# Patient Record
Sex: Male | Born: 1980 | Race: White | Hispanic: No | Smoking: Former smoker
Health system: Southern US, Community
[De-identification: ages and names within clinical notes are randomized; demographics above are authoritative.]

## PROBLEM LIST (undated history)

## (undated) DIAGNOSIS — E7800 Pure hypercholesterolemia, unspecified: Secondary | ICD-10-CM

## (undated) DIAGNOSIS — K219 Gastro-esophageal reflux disease without esophagitis: Secondary | ICD-10-CM

## (undated) DIAGNOSIS — M79606 Pain in leg, unspecified: Secondary | ICD-10-CM

## (undated) DIAGNOSIS — R079 Chest pain, unspecified: Secondary | ICD-10-CM

## (undated) HISTORY — PX: KNEE SURGERY: SHX244

---
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
## 2007-04-27 ENCOUNTER — Ambulatory Visit: Payer: Self-pay | Admitting: Vascular Surgery

## 2007-04-27 ENCOUNTER — Ambulatory Visit (HOSPITAL_COMMUNITY): Admission: RE | Admit: 2007-04-27 | Discharge: 2007-04-27 | Payer: Self-pay | Admitting: Orthopaedic Surgery

## 2007-08-07 ENCOUNTER — Emergency Department (HOSPITAL_COMMUNITY): Admission: EM | Admit: 2007-08-07 | Discharge: 2007-08-07 | Payer: Self-pay | Admitting: Family Medicine

## 2011-07-22 LAB — DIFFERENTIAL
Basophils Absolute: 0.1
Basophils Relative: 0
Lymphocytes Relative: 11 — ABNORMAL LOW
Neutro Abs: 11.1 — ABNORMAL HIGH
Neutrophils Relative %: 79 — ABNORMAL HIGH

## 2011-07-22 LAB — GC/CHLAMYDIA PROBE AMP, GENITAL
Chlamydia, DNA Probe: NEGATIVE
GC Probe Amp, Genital: NEGATIVE

## 2011-07-22 LAB — CBC
MCHC: 34.1
RDW: 13.9

## 2011-07-22 LAB — POCT URINALYSIS DIP (DEVICE)
Glucose, UA: NEGATIVE
Ketones, ur: 15 — AB
Operator id: 282151
Protein, ur: 30 — AB
Specific Gravity, Urine: 1.02
Urobilinogen, UA: 4 — ABNORMAL HIGH

## 2011-07-22 LAB — URINE CULTURE: Colony Count: 3000

## 2012-07-30 ENCOUNTER — Ambulatory Visit: Payer: Self-pay | Admitting: Family Medicine

## 2012-07-30 VITALS — BP 118/68 | HR 68 | Temp 98.2°F | Resp 16 | Ht 70.0 in | Wt 173.8 lb

## 2012-07-30 DIAGNOSIS — J40 Bronchitis, not specified as acute or chronic: Secondary | ICD-10-CM

## 2012-07-30 MED ORDER — AZITHROMYCIN 250 MG PO TABS: ORAL_TABLET | ORAL | Status: DC

## 2012-07-30 MED ORDER — PREDNISONE 20 MG PO TABS: 40.0000 mg | ORAL_TABLET | Freq: Every day | ORAL | Status: DC

## 2012-07-30 NOTE — Patient Instructions (Addendum)
Smoking Cessation Quitting smoking is important to your health and has many advantages. However, it is not always easy to quit since nicotine is a very addictive drug. Often times, people try 3 times or more before being able to quit. This document explains the best ways for you to prepare to quit smoking. Quitting takes hard work and a lot of effort, but you can do it. ADVANTAGES OF QUITTING SMOKING  You will live longer, feel better, and live better.  Your body will feel the impact of quitting smoking almost immediately.  Within 20 minutes, blood pressure decreases. Your pulse returns to its normal level.  After 8 hours, carbon monoxide levels in the blood return to normal. Your oxygen level increases.  After 24 hours, the chance of having a heart attack starts to decrease. Your breath, hair, and body stop smelling like smoke.  After 48 hours, damaged nerve endings begin to recover. Your sense of taste and smell improve.  After 72 hours, the body is virtually free of nicotine. Your bronchial tubes relax and breathing becomes easier.  After 2 to 12 weeks, lungs can hold more air. Exercise becomes easier and circulation improves.  The risk of having a heart attack, stroke, cancer, or lung disease is greatly reduced.  After 1 year, the risk of coronary heart disease is cut in half.  After 5 years, the risk of stroke falls to the same as a nonsmoker.  After 10 years, the risk of lung cancer is cut in half and the risk of other cancers decreases significantly.  After 15 years, the risk of coronary heart disease drops, usually to the level of a nonsmoker.  If you are pregnant, quitting smoking will improve your chances of having a healthy baby.  The people you live with, especially any children, will be healthier.  You will have extra money to spend on things other than cigarettes. QUESTIONS TO THINK ABOUT BEFORE ATTEMPTING TO QUIT You may want to talk about your answers with your  caregiver.  Why do you want to quit?  If you tried to quit in the past, what helped and what did not?  What will be the most difficult situations for you after you quit? How will you plan to handle them?  Who can help you through the tough times? Your family? Friends? A caregiver?  What pleasures do you get from smoking? What ways can you still get pleasure if you quit? Here are some questions to ask your caregiver:  How can you help me to be successful at quitting?  What medicine do you think would be best for me and how should I take it?  What should I do if I need more help?  What is smoking withdrawal like? How can I get information on withdrawal? GET READY  Set a quit date.  Change your environment by getting rid of all cigarettes, ashtrays, matches, and lighters in your home, car, or work. Do not let people smoke in your home.  Review your past attempts to quit. Think about what worked and what did not. GET SUPPORT AND ENCOURAGEMENT You have a better chance of being successful if you have help. You can get support in many ways.  Tell your family, friends, and co-workers that you are going to quit and need their support. Ask them not to smoke around you.  Get individual, group, or telephone counseling and support. Programs are available at local hospitals and health centers. Call your local health department for   information about programs in your area.  Spiritual beliefs and practices may help some smokers quit.  Download a "quit meter" on your computer to keep track of quit statistics, such as how long you have gone without smoking, cigarettes not smoked, and money saved.  Get a self-help book about quitting smoking and staying off of tobacco. LEARN NEW SKILLS AND BEHAVIORS  Distract yourself from urges to smoke. Talk to someone, go for a walk, or occupy your time with a task.  Change your normal routine. Take a different route to work. Drink tea instead of coffee.  Eat breakfast in a different place.  Reduce your stress. Take a hot bath, exercise, or read a book.  Plan something enjoyable to do every day. Reward yourself for not smoking.  Explore interactive web-based programs that specialize in helping you quit. GET MEDICINE AND USE IT CORRECTLY Medicines can help you stop smoking and decrease the urge to smoke. Combining medicine with the above behavioral methods and support can greatly increase your chances of successfully quitting smoking.  Nicotine replacement therapy helps deliver nicotine to your body without the negative effects and risks of smoking. Nicotine replacement therapy includes nicotine gum, lozenges, inhalers, nasal sprays, and skin patches. Some may be available over-the-counter and others require a prescription.  Antidepressant medicine helps people abstain from smoking, but how this works is unknown. This medicine is available by prescription.  Nicotinic receptor partial agonist medicine simulates the effect of nicotine in your brain. This medicine is available by prescription. Ask your caregiver for advice about which medicines to use and how to use them based on your health history. Your caregiver will tell you what side effects to look out for if you choose to be on a medicine or therapy. Carefully read the information on the package. Do not use any other product containing nicotine while using a nicotine replacement product.  RELAPSE OR DIFFICULT SITUATIONS Most relapses occur within the first 3 months after quitting. Do not be discouraged if you start smoking again. Remember, most people try several times before finally quitting. You may have symptoms of withdrawal because your body is used to nicotine. You may crave cigarettes, be irritable, feel very hungry, cough often, get headaches, or have difficulty concentrating. The withdrawal symptoms are only temporary. They are strongest when you first quit, but they will go away within  10 14 days. To reduce the chances of relapse, try to:  Avoid drinking alcohol. Drinking lowers your chances of successfully quitting.  Reduce the amount of caffeine you consume. Once you quit smoking, the amount of caffeine in your body increases and can give you symptoms, such as a rapid heartbeat, sweating, and anxiety.  Avoid smokers because they can make you want to smoke.  Do not let weight gain distract you. Many smokers will gain weight when they quit, usually less than 10 pounds. Eat a healthy diet and stay active. You can always lose the weight gained after you quit.  Find ways to improve your mood other than smoking. FOR MORE INFORMATION  www.smokefree.gov  Document Released: 09/22/2001 Document Revised: 03/29/2012 Document Reviewed: 01/07/2012 [employer] Patient Information 2013 [employer].  

## 2012-07-30 NOTE — Progress Notes (Signed)
31 yo man with cough x 2 days.  No fever.  Phlegm is brownish. Working in Buyer, retail in [place] Recent trip to [place] Wife due in February  Objective:  NAD HEENT: normal Chest:  Wheezes  Assessment:  Acute bronchitis  Plan:   1. Bronchitis  azithromycin (ZITHROMAX Z-PAK) 250 MG tablet, predniSONE (DELTASONE) 20 MG tablet  encouraged to quit cigarettes

## 2013-10-16 ENCOUNTER — Ambulatory Visit: Payer: Self-pay | Admitting: Internal Medicine

## 2013-10-16 VITALS — BP 100/60 | HR 90 | Temp 99.6°F | Resp 18 | Ht 69.0 in | Wt 180.0 lb

## 2013-10-16 DIAGNOSIS — J111 Influenza due to unidentified influenza virus with other respiratory manifestations: Secondary | ICD-10-CM

## 2013-10-16 DIAGNOSIS — R509 Fever, unspecified: Secondary | ICD-10-CM

## 2013-10-16 MED ORDER — HYDROCODONE-HOMATROPINE 5-1.5 MG/5ML PO SYRP: 5.0000 mL | ORAL_SOLUTION | Freq: Four times a day (QID) | ORAL | Status: DC | PRN

## 2013-10-16 MED ORDER — DICLOFENAC SODIUM 75 MG PO TBEC: 75.0000 mg | DELAYED_RELEASE_TABLET | Freq: Two times a day (BID) | ORAL | Status: DC

## 2013-10-16 NOTE — Patient Instructions (Signed)

## 2013-10-16 NOTE — Progress Notes (Signed)
Subjective:    Patient ID: Carlos Lambert, male    DOB: 08/04/1981, 33 y.o.   MRN: [id number]  This [facility] by Carlos Lambert, Scribe. This patient was seen in room 11 and the patient's care was started at 4:21 PM.  Fever  This is a new problem. The current episode started in the past 7 days. The problem occurs constantly. The problem has been unchanged. His temperature was unmeasured prior to arrival. Associated symptoms include coughing and headaches. Pertinent negatives include no abdominal pain, chest pain, congestion, diarrhea, nausea, rash, sore throat, vomiting or wheezing. He has tried nothing for the symptoms.   HPI Comments: Carlos Lambert is a 33 y.o. male who presents to the [facility] complaining of a constant subjective fever that started about 4 days ago.  Current temperature is 99.87F.  Pt has associated non productive cough, HA, and generalized weakness.  Pt denies sore throat and rhinorrhea.  He denies receiving the flu shot this year.     Past Surgical History  Procedure Laterality Date  . Knee surgery      Family History  Problem Relation Age of Onset  . Hypertension Father     History   Social History  . Marital Status: Married    Spouse Name: N/A    Number of Children: N/A  . Years of Education: N/A   Occupational History  . Not on file.   Social History Main Topics  . Smoking status: Former Games developermoker  . Smokeless tobacco: Not on file  . Alcohol Use: Not on file  . Drug Use: Not on file  . Sexual Activity: Not on file   Other Topics Concern  . Not on file   Social History Narrative  . No narrative on file    Review of Systems  Constitutional: Positive for fever. Negative for chills.  HENT: Negative for congestion, rhinorrhea and sore throat.   Respiratory: Positive for cough. Negative for shortness of breath and wheezing.   Cardiovascular: Negative for chest pain.  Gastrointestinal: Negative  for nausea, vomiting, abdominal pain and diarrhea.  Musculoskeletal: Negative for back pain.  Skin: Negative for color change and rash.  Neurological: Positive for headaches.      Objective:   Physical Exam  Nursing note and vitals reviewed. Constitutional: He is oriented to person, place, and time. He appears well-developed and well-nourished. No distress.  HENT:  Head: Normocephalic and atraumatic.  Eyes: Conjunctivae are normal. Right eye exhibits no discharge. Left eye exhibits no discharge.  Neck: Normal range of motion.  Cardiovascular: Normal rate, regular rhythm and normal heart sounds.   No murmur heard. Pulmonary/Chest: Effort normal and breath sounds normal. No respiratory distress. He has no wheezes.  Musculoskeletal: Normal range of motion.  Neurological: He is alert and oriented to person, place, and time.  Skin: Skin is warm and dry.  Psychiatric: He has a normal mood and affect. His behavior is normal.   Triage Vitals: P 100/60  Pulse 90  Temp(Src) 99.6 F (37.6 C) (Oral)  Resp 18  Ht 5\' 9"  (1.753 m)  Wt 180 lb (81.647 kg)  BMI 26.57 kg/m2  SpO2 97%  DIAGNOSTIC STUDIES: Oxygen Saturation is 97% on RA, normal by my interpretation.    COORDINATION OF CARE: 4:24 PM-Will prescribe cough medicine and Voltaren.  Patient informed of current plan of treatment and evaluation and agrees with plan.       Assessment & Plan:  Influenza  Meds ordered this encounter  Medications  . Pseudoephedrine-APAP-DM (DAYQUIL PO)    Sig: Take by mouth.  . diclofenac (VOLTAREN) 75 MG EC tablet    Sig: Take 1 tablet (75 mg total) by mouth 2 (two) times daily. As needed for fever, Headache and muscle aches    Dispense:  30 tablet    Refill:  0  . HYDROcodone-homatropine (HYCODAN) 5-1.5 MG/5ML syrup    Sig: Take 5 mLs by mouth every 6 (six) hours as needed for cough.    Dispense:  120 mL    Refill:  0

## 2013-10-17 ENCOUNTER — Telehealth: Payer: Self-pay

## 2013-10-17 NOTE — Telephone Encounter (Signed)
Notified pt I have OOW note for him if he needs it. Pt advised he already has one and is not needed.

## 2013-12-18 ENCOUNTER — Ambulatory Visit (INDEPENDENT_AMBULATORY_CARE_PROVIDER_SITE_OTHER): Payer: 59 | Admitting: Family Medicine

## 2013-12-18 VITALS — BP 98/58 | HR 66 | Temp 97.5°F | Resp 16 | Ht 69.0 in | Wt 185.0 lb

## 2013-12-18 DIAGNOSIS — R361 Hematospermia: Secondary | ICD-10-CM

## 2013-12-18 DIAGNOSIS — Z9889 Other specified postprocedural states: Secondary | ICD-10-CM

## 2013-12-18 DIAGNOSIS — Z0000 Encounter for general adult medical examination without abnormal findings: Secondary | ICD-10-CM

## 2013-12-18 DIAGNOSIS — Z630 Problems in relationship with spouse or partner: Secondary | ICD-10-CM

## 2013-12-18 LAB — POCT UA - MICROSCOPIC ONLY
BACTERIA, U MICROSCOPIC: NEGATIVE
CASTS, UR, LPF, POC: NEGATIVE
Crystals, Ur, HPF, POC: NEGATIVE
Epithelial cells, urine per micros: NEGATIVE
Mucus, UA: NEGATIVE
RBC, URINE, MICROSCOPIC: NEGATIVE
WBC, Ur, HPF, POC: NEGATIVE
Yeast, UA: NEGATIVE

## 2013-12-18 LAB — POCT CBC
Granulocyte percent: 54 %G (ref 37–80)
HCT, POC: 43.9 % (ref 43.5–53.7)
HEMOGLOBIN: 14.1 g/dL (ref 14.1–18.1)
Lymph, poc: 2.8 (ref 0.6–3.4)
MCH, POC: 30.5 pg (ref 27–31.2)
MCHC: 32.1 g/dL (ref 31.8–35.4)
MCV: 95.1 fL (ref 80–97)
MID (cbc): 0.6 (ref 0–0.9)
MPV: 8.8 fL (ref 0–99.8)
PLATELET COUNT, POC: 248 10*3/uL (ref 142–424)
POC GRANULOCYTE: 3.9 (ref 2–6.9)
POC LYMPH PERCENT: 38.1 %L (ref 10–50)
POC MID %: 7.9 % (ref 0–12)
RBC: 4.62 M/uL — AB (ref 4.69–6.13)
RDW, POC: 13.9 %
WBC: 7.3 10*3/uL (ref 4.6–10.2)

## 2013-12-18 LAB — COMPREHENSIVE METABOLIC PANEL
ALK PHOS: 50 U/L (ref 39–117)
ALT: 15 U/L (ref 0–53)
AST: 17 U/L (ref 0–37)
Albumin: 4.8 g/dL (ref 3.5–5.2)
BILIRUBIN TOTAL: 0.4 mg/dL (ref 0.2–1.2)
BUN: 17 mg/dL (ref 6–23)
CO2: 30 meq/L (ref 19–32)
CREATININE: 0.93 mg/dL (ref 0.50–1.35)
Calcium: 9.6 mg/dL (ref 8.4–10.5)
Chloride: 102 mEq/L (ref 96–112)
GLUCOSE: 99 mg/dL (ref 70–99)
Potassium: 4.4 mEq/L (ref 3.5–5.3)
SODIUM: 138 meq/L (ref 135–145)
TOTAL PROTEIN: 7.1 g/dL (ref 6.0–8.3)

## 2013-12-18 LAB — POCT URINALYSIS DIPSTICK
BILIRUBIN UA: NEGATIVE
Blood, UA: NEGATIVE
GLUCOSE UA: NEGATIVE
Ketones, UA: NEGATIVE
LEUKOCYTES UA: NEGATIVE
NITRITE UA: NEGATIVE
PH UA: 5.5
Protein, UA: NEGATIVE
Spec Grav, UA: <=1.005
UROBILINOGEN UA: 0.2

## 2013-12-18 LAB — LIPID PANEL
CHOL/HDL RATIO: 5.2 ratio
CHOLESTEROL: 199 mg/dL (ref 0–200)
HDL: 38 mg/dL — AB (ref >39)
LDL CALC: 130 mg/dL — AB (ref 0–99)
TRIGLYCERIDES: 153 mg/dL — AB (ref <150)
VLDL: 31 mg/dL (ref 0–40)

## 2013-12-18 LAB — PSA: PSA: 0.91 ng/mL (ref <=4.00)

## 2013-12-18 LAB — HIV ANTIBODY (ROUTINE TESTING W REFLEX): HIV: NONREACTIVE

## 2013-12-18 LAB — RPR

## 2013-12-18 LAB — TSH: TSH: 3.19 u[IU]/mL (ref 0.350–4.500)

## 2013-12-18 MED ORDER — CIPROFLOXACIN HCL 500 MG PO TABS: 500.0000 mg | ORAL_TABLET | Freq: Two times a day (BID) | ORAL | Status: DC

## 2013-12-18 NOTE — Patient Instructions (Addendum)
Return as needed  Ciprofloxacin 500 mg one twice daily  If blood persists in semen we will need to make a urology referral so let me know.

## 2013-12-18 NOTE — Progress Notes (Signed)
Physical examination:  History: 33 year old male who is here for physical examination. He has been noticing a dark brown in his semen. It is more noticeable when he has gone longer intervals between masturbation. He has no other major symptoms related to that. No history of STDs. However his wife did leave him about 6 weeks ago, and she moved back to [place]. There is no known instability, and he has not had any other relationships. Since he was last seen by me he has had surgery on the right anterior cruciate ligament, and has done well from that.  Past medical history: Medications: None Allergies: None Past medical illnesses: None Surgical history: Right anterior cruciate ligament repair of  Family history: Both parents are living, and high cholesterol. His brother and sister living and well. He has one child who is 79 months old.  Social history: Wife recently left him as noted above. She also left his son, who he is raising with the assistance of his parents. 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 He has not play sports he does walk. He does smoke one pack of cigarettes a day, does not drink or use drugs. 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 He intends to return to that sometime.  Review of systems: Constitutional: Unremarkable HEENT: Unremarkable Respiratory: Unremarkable Cardiovascular: Unremarkable Gastrointestinal: Unremarkable Endocrine: Unremarkable Genitourinary: No dysuria as noted above Musculoskeletal: Unremarkable except for the history of the knee surgery. It is doing well. Dermatologic: Unremarkable Allergies: Unremarkable Neurologic: Unremarkable Hematologic: Unremarkable Psychiatric: Unremarkable. Denies depression and feels like he is doing well, that it was a relief with his wife left him.  Physical examination: Well-developed well-nourished young man in no  major acute distress. His TMs are normal. Eyes PERRLA. Fundi benign. Throat clear. Teeth good. Neck supple without nodes. Chest is clear to auscultation. Heart regular without murmurs gallops or arrhythmias. Abdomen soft without mass or tenderness. Normal male external genitalia, uncircumcised, testes descended. No hernias. Digital rectal exam reveals prostate gland to be essentially normal in size and contour. Extremities unremarkable. Skin has tattoos on his arms, otherwise unremarkable.  Assessment: Normal physical examination A history of anterior cruciate ligament repair History of marital dysfunction Meds for me a  Plan: Check labs including CBC and urinalysis, and STD testing, C. met, lipids, TSH and will also do a PSA and he is young because of the change in his ejaculate.   Results for orders placed in visit on 12/18/13  POCT CBC      Result Value Ref Range   WBC 7.3  4.6 - 10.2 K/uL   Lymph, poc 2.8  0.6 - 3.4   POC LYMPH PERCENT 38.1  10 - 50 %L   MID (cbc) 0.6  0 - 0.9   POC MID % 7.9  0 - 12 %M   POC Granulocyte 3.9  2 - 6.9   Granulocyte percent 54.0  37 - 80 %G   RBC 4.62 (*) 4.69 - 6.13 M/uL   Hemoglobin 14.1  14.1 - 18.1 g/dL   HCT, POC 43.9  43.5 - 53.7 %   MCV 95.1  80 - 97 fL   MCH, POC 30.5  27 - 31.2 pg   MCHC 32.1  31.8 - 35.4 g/dL   RDW, POC 13.9     Platelet Count, POC 248  142 - 424 K/uL  MPV 8.8  0 - 99.8 fL  POCT URINALYSIS DIPSTICK      Result Value Ref Range   Color, UA light yellow     Clarity, UA clear     Glucose, UA neg     Bilirubin, UA neg     Ketones, UA neg     Spec Grav, UA <=1.005     Blood, UA neg     pH, UA 5.5     Protein, UA neg     Urobilinogen, UA 0.2     Nitrite, UA neg     Leukocytes, UA Negative    POCT UA - MICROSCOPIC ONLY      Result Value Ref Range   WBC, Ur, HPF, POC neg     RBC, urine, microscopic neg     Bacteria, U Microscopic neg     Mucus, UA neg     Epithelial cells, urine per micros neg     Crystals, Ur,  HPF, POC neg     Casts, Ur, LPF, POC neg     Yeast, UA neg

## 2013-12-19 LAB — GC/CHLAMYDIA PROBE AMP
CT Probe RNA: NEGATIVE
GC PROBE AMP APTIMA: NEGATIVE

## 2013-12-19 LAB — HSV(HERPES SIMPLEX VRS) I + II AB-IGG
HSV 1 Glycoprotein G Ab, IgG: 9.74 IV — ABNORMAL HIGH
HSV 2 Glycoprotein G Ab, IgG: <0.1 IV

## 2016-08-21 ENCOUNTER — Encounter: Payer: Self-pay | Admitting: Urgent Care

## 2016-08-21 ENCOUNTER — Ambulatory Visit (INDEPENDENT_AMBULATORY_CARE_PROVIDER_SITE_OTHER): Payer: 59 | Admitting: Urgent Care

## 2016-08-21 VITALS — BP 116/78 | HR 64 | Temp 97.9°F | Resp 16 | Ht 69.0 in | Wt 188.6 lb

## 2016-08-21 DIAGNOSIS — Z0000 Encounter for general adult medical examination without abnormal findings: Secondary | ICD-10-CM | POA: Diagnosis not present

## 2016-08-21 DIAGNOSIS — Z87891 Personal history of nicotine dependence: Secondary | ICD-10-CM

## 2016-08-21 LAB — TSH: TSH: 2.05 mIU/L (ref 0.40–4.50)

## 2016-08-21 LAB — CBC
HCT: 44 % (ref 38.5–50.0)
Hemoglobin: 14.9 g/dL (ref 13.2–17.1)
MCH: 30.5 pg (ref 27.0–33.0)
MCHC: 33.9 g/dL (ref 32.0–36.0)
MCV: 90.2 fL (ref 80.0–100.0)
MPV: 9.1 fL (ref 7.5–12.5)
Platelets: 226 10*3/uL (ref 140–400)
RBC: 4.88 MIL/uL (ref 4.20–5.80)
RDW: 13.6 % (ref 11.0–15.0)
WBC: 6 10*3/uL (ref 3.8–10.8)

## 2016-08-21 LAB — LIPID PANEL
CHOLESTEROL: 225 mg/dL — AB (ref <200)
HDL: 36 mg/dL — ABNORMAL LOW (ref >40)
LDL Cholesterol: 148 mg/dL — ABNORMAL HIGH
TRIGLYCERIDES: 205 mg/dL — AB (ref <150)
Total CHOL/HDL Ratio: 6.3 Ratio — ABNORMAL HIGH (ref <5.0)
VLDL: 41 mg/dL — ABNORMAL HIGH (ref <30)

## 2016-08-21 LAB — BASIC METABOLIC PANEL
BUN: 16 mg/dL (ref 7–25)
CHLORIDE: 106 mmol/L (ref 98–110)
CO2: 26 mmol/L (ref 20–31)
Calcium: 9.3 mg/dL (ref 8.6–10.3)
Creat: 0.9 mg/dL (ref 0.60–1.35)
GLUCOSE: 97 mg/dL (ref 65–99)
POTASSIUM: 4.7 mmol/L (ref 3.5–5.3)
SODIUM: 141 mmol/L (ref 135–146)

## 2016-08-21 NOTE — Progress Notes (Signed)
MRN: [id number]  Subjective:   Carlos Lambert is a 35 y.o. male presenting for annual physical exam.  Patient is divorced, has 1 son, good relationships at home. Declines screening for HIV. Used to smoke 1ppd for 10 years. Quit smoking 1.5 months ago. This came about because his mother was diagnosed with small cell lung carcinoma recently and he is currently helping her get treatment. Denies smoking cigarettes or drinking alcohol.   Medical care team includes: PCP: No PCP Per Patient Vision: Wears contact lenses, last eye exam was ~2 years ago, plans on setting up an eye exam soon. Dental: Cleanings once a year. Specialists: None.    Carlos Lambert does not have any active problems on his problem list.   Carlos Lambert currently has no medications in their medication list. He has No Known Allergies.  Carlos Lambert  has no past medical history on file. Also  has a past surgical history that includes Knee surgery. Had ACL repair of right knee.  His family history includes Hypertension in his father.  Immunizations: Refused flu vaccine. TDAP 08/2014.  Review of Systems  Constitutional: Negative for chills, diaphoresis, fever, malaise/fatigue and weight loss.  HENT: Negative for congestion, ear discharge, ear pain, hearing loss, nosebleeds, sore throat and tinnitus.   Eyes: Negative for blurred vision, double vision, photophobia, pain, discharge and redness.  Respiratory: Negative for cough, shortness of breath and wheezing.   Cardiovascular: Negative for chest pain, palpitations and leg swelling.  Gastrointestinal: Negative for abdominal pain, blood in stool, constipation, diarrhea, nausea and vomiting.  Genitourinary: Negative for dysuria, flank pain, frequency, hematuria and urgency.  Musculoskeletal: Negative for back pain, joint pain and myalgias.  Skin: Negative for itching and rash.  Neurological: Negative for dizziness, tingling, seizures, loss of consciousness, weakness and headaches.    Endo/Heme/Allergies: Negative for polydipsia.  Psychiatric/Behavioral: Negative for depression, hallucinations, memory loss, substance abuse and suicidal ideas. The patient is not nervous/anxious and does not have insomnia.    Objective:   Vitals: BP 116/78 (BP Location: Right Arm, Patient Position: Sitting, Cuff Size: Small)   Pulse 64   Temp 97.9 F (36.6 C) (Oral)   Resp 16   Ht 5\' 9"  (1.753 m)   Wt 188 lb 9.6 oz (85.5 kg)   SpO2 98%   BMI 27.85 kg/m   Physical Exam  Constitutional: He is oriented to person, place, and time. He appears well-developed and well-nourished.  HENT:  TM's intact bilaterally, no effusions or erythema. Nasal turbinates pink and moist, nasal passages patent. No sinus tenderness. Oropharynx clear, mucous membranes moist, dentition in good repair.  Eyes: Conjunctivae and EOM are normal. Pupils are equal, round, and reactive to light. Right eye exhibits no discharge. Left eye exhibits no discharge. No scleral icterus.  Neck: Normal range of motion. Neck supple. No thyromegaly present.  Cardiovascular: Normal rate, regular rhythm and intact distal pulses.  Exam reveals no gallop and no friction rub.   No murmur heard. Pulmonary/Chest: No stridor. No respiratory distress. He has no wheezes. He has no rales.  Abdominal: Soft. Bowel sounds are normal. He exhibits no distension and no mass. There is no tenderness.  Musculoskeletal: Normal range of motion. He exhibits no edema or tenderness.  Lymphadenopathy:    He has no cervical adenopathy.  Neurological: He is alert and oriented to person, place, and time. He has normal reflexes.  Skin: Skin is warm and dry. No rash noted. No erythema. No pallor.  Psychiatric: He has a normal mood  and affect.   Assessment and Plan :   1. Annual physical exam - Medically healthy, pleasant person. Discussed healthy lifestyle, diet, exercise, preventative care, vaccinations, and addressed patient's concerns.  - CBC -  Lipid panel - TSH - Basic metabolic panel  2. History of smoking for 6-10 years - Congratulated patient on smoking cessation. Encouraged him to not pick smoking back up.  Wallis BambergMario Oliverio Cho, PA-C [facility] [phone] 08/21/2016  8:52 AM

## 2016-08-21 NOTE — Patient Instructions (Addendum)
Keeping you healthy  Get these tests  Blood pressure- Have your blood pressure checked once a year by your healthcare provider.  Normal blood pressure is 120/80.  Weight- Have your body mass index (BMI) calculated to screen for obesity.  BMI is a measure of body fat based on height and weight. You can also calculate your own BMI at https://www.west-esparza.com/www.nhlbisupport.com/bmi/.  Cholesterol- Have your cholesterol checked regularly starting at age 33, sooner may be necessary if you have diabetes, high blood pressure, if a family member developed heart diseases at an early age or if you smoke.   Chlamydia, HIV, and other sexual transmitted disease- Get screened each year until the age of 33 then within three months of each new sexual partner.  Diabetes- Have your blood sugar checked regularly if you have high blood pressure, high cholesterol, a family history of diabetes or if you are overweight.  Get these vaccines  Flu shot- Every fall.  Tetanus shot- Every 10 years.  Menactra- Single dose; prevents meningitis.  Take these steps  Don't smoke- If you do smoke, ask your healthcare provider about quitting. For tips on how to quit, go to www.smokefree.gov or call 1-800-QUIT-NOW.  Be physically active- Exercise 5 days a week for at least 30 minutes.  If you are not already physically active start slow and gradually work up to 30 minutes of moderate physical activity.  Examples of moderate activity include walking briskly, mowing the yard, dancing, swimming bicycling, etc.  Eat a healthy diet- Eat a variety of healthy foods such as fruits, vegetables, low fat milk, low fat cheese, yogurt, lean meats, poultry, fish, beans, tofu, etc.  For more information on healthy eating, go to www.thenutritionsource.org  Drink alcohol in moderation- Limit alcohol intake two drinks or less a day.  Never drink and drive.  Dentist- Brush and floss teeth twice daily; visit your dentis twice a year.  Depression-Your emotional  health is as important as your physical health.  If you're feeling down, losing interest in things you normally enjoy please talk with your healthcare provider.  Gun Safety- If you keep a gun in your home, keep it unloaded and with the safety lock on.  Bullets should be stored separately.  Helmet use- Always wear a helmet when riding a motorcycle, bicycle, rollerblading or skateboarding.  Safe sex- If you may be exposed to a sexually transmitted infection, use a condom  Seat belts- Seat bels can save your life; always wear one.  Smoke/Carbon Monoxide detectors- These detectors need to be installed on the appropriate level of your home.  Replace batteries at least once a year.  Skin Cancer- When out in the sun, cover up and use sunscreen SPF 15 or higher.  Violence- If anyone is threatening or hurting you, please tell your healthcare provider.    Smoking Hazards Smoking cigarettes is extremely bad for your health. Tobacco smoke has over 200 known poisons in it. It contains the poisonous gases nitrogen oxide and carbon monoxide. There are over 60 chemicals in tobacco smoke that cause cancer. Some of the chemicals found in cigarette smoke include:   Cyanide.   Benzene.   Formaldehyde.   Methanol (wood alcohol).   Acetylene (fuel used in welding torches).   Ammonia.  Even smoking lightly shortens your life expectancy by several years. You can greatly reduce the risk of medical problems for you and your family by stopping now. Smoking is the most preventable cause of death and disease in our society. Within days  of quitting smoking, your circulation improves, you decrease the risk of having a heart attack, and your lung capacity improves. There may be some increased phlegm in the first few days after quitting, and it may take months for your lungs to clear up completely. Quitting for 10 years reduces your risk of developing lung cancer to almost that of a nonsmoker.  WHAT ARE THE  RISKS OF SMOKING? Cigarette smokers have an increased risk of many serious medical problems, including:  Lung cancer.   Lung disease (such as pneumonia, bronchitis, and emphysema).   Heart attack and chest pain due to the heart not getting enough oxygen (angina).   Heart disease and peripheral blood vessel disease.   Hypertension.   Stroke.   Oral cancer (cancer of the lip, mouth, or voice box).   Bladder cancer.   Pancreatic cancer.   Cervical cancer.   Pregnancy complications, including premature birth.   Stillbirths and smaller newborn babies, birth defects, and genetic damage to sperm.   Early menopause.   Lower estrogen level for women.   Infertility.   Facial wrinkles.   Blindness.   Increased risk of broken bones (fractures).   Senile dementia.   Stomach ulcers and internal bleeding.   Delayed wound healing and increased risk of complications during surgery. Because of secondhand smoke exposure, children of smokers have an increased risk of the following:   Sudden infant death syndrome (SIDS).   Respiratory infections.   Lung cancer.   Heart disease.   Ear infections.  WHY IS SMOKING ADDICTIVE? Nicotine is the chemical agent in tobacco that is capable of causing addiction or dependence. When you smoke and inhale, nicotine is absorbed rapidly into the bloodstream through your lungs. Both inhaled and noninhaled nicotine may be addictive.  WHAT ARE THE BENEFITS OF QUITTING?  There are many health benefits to quitting smoking. Some are:   The likelihood of developing cancer and heart disease decreases. Health improvements are seen almost immediately.   Blood pressure, pulse rate, and breathing patterns start returning to normal soon after quitting.   People who quit may see an improvement in their overall quality of life.  HOW DO YOU QUIT SMOKING? Smoking is an addiction with both physical and psychological effects, and  longtime habits can be hard to change. Your health care provider can recommend:  Programs and community resources, which may include group support, education, or therapy.  Replacement products, such as patches, gum, and nasal sprays. Use these products only as directed. Do not replace cigarette smoking with electronic cigarettes (commonly called e-cigarettes). The safety of e-cigarettes is unknown, and some may contain harmful chemicals. FOR MORE INFORMATION  American Lung Association: www.lung.org  American Cancer Society: www.cancer.org   This information is not intended to replace advice given to you by your health care provider. Make sure you discuss any questions you have with your health care provider.   Document Released: 11/05/2004 Document Revised: 07/19/2013 Document Reviewed: 03/20/2013 Elsevier Interactive Patient Education 2016 [employer]     IF you received an x-ray today, you will receive an invoice from [facility]. Please contact [facility] at [phone] with questions or concerns regarding your invoice.   IF you received labwork today, you will receive an invoice from [facility]. Please contact [facility] at [phone] with questions or concerns regarding your invoice.   Our billing staff will not be able to assist you with questions regarding bills from these companies.  You will be contacted with  the lab results as soon as they are available. The fastest way to get your results is to activate your My Chart account. Instructions are located on the last page of this paperwork. If you have not heard from us regarding the results in 2 weeks, please contact this office.      

## 2016-08-25 ENCOUNTER — Encounter: Payer: Self-pay | Admitting: Urgent Care

## 2016-10-07 ENCOUNTER — Ambulatory Visit (INDEPENDENT_AMBULATORY_CARE_PROVIDER_SITE_OTHER): Payer: 59 | Admitting: Physician Assistant

## 2016-10-07 VITALS — BP 114/72 | HR 65 | Temp 97.8°F | Resp 18 | Ht 69.0 in | Wt 199.6 lb

## 2016-10-07 DIAGNOSIS — R059 Cough, unspecified: Secondary | ICD-10-CM

## 2016-10-07 DIAGNOSIS — R058 Other specified cough: Secondary | ICD-10-CM

## 2016-10-07 DIAGNOSIS — R05 Cough: Secondary | ICD-10-CM

## 2016-10-07 MED ORDER — HYDROCODONE-HOMATROPINE 5-1.5 MG/5ML PO SYRP: 5.0000 mL | ORAL_SOLUTION | Freq: Three times a day (TID) | ORAL | 0 refills | Status: DC | PRN

## 2016-10-07 MED ORDER — BENZONATATE 100 MG PO CAPS: 100.0000 mg | ORAL_CAPSULE | Freq: Three times a day (TID) | ORAL | 0 refills | Status: DC | PRN

## 2016-10-07 NOTE — Progress Notes (Signed)
   Albertha GheeZeljko Tschetter  MRN: [id number] DOB: 07/05/1981  PCP: No PCP Per Patient  Subjective:  Pt is a 35 year old male who presents to clinic for cough.  Dry nose and throat x 10 days. He was coughing up plegm in the morning. Has progressed to a dry cough and he is no longer producing phlegm. Worse in the morning, "very dry" throat in the morning. He is sleeping not very well. Does not have humidifier in his room. He is sensitive to heat and talking - makes him cough.  Denies fever, chills, chest pain, wheezing, chest tightness, abdominal pain, headache, sinus pressure, ear pain, nausea, vomiting.   Review of Systems  Constitutional: Negative for chills, diaphoresis and fever.  HENT: Positive for rhinorrhea and sore throat. Negative for congestion, postnasal drip, sinus pain, sinus pressure and sneezing.   Respiratory: Positive for cough. Negative for chest tightness, shortness of breath and wheezing.   Cardiovascular: Negative for chest pain and palpitations.  Gastrointestinal: Negative for diarrhea, nausea and vomiting.  Musculoskeletal: Negative for neck pain.  Neurological: Negative for dizziness, syncope, light-headedness and headaches.  Psychiatric/Behavioral: Negative for sleep disturbance. The patient is not nervous/anxious.     There are no active problems to display for this patient.   No current outpatient prescriptions on file prior to visit.   No current facility-administered medications on file prior to visit.     No Known Allergies   Objective:  BP 114/72 (BP Location: Right Arm, Patient Position: Sitting, Cuff Size: Small)   Pulse 65   Temp 97.8 F (36.6 C) (Oral)   Resp 18   Ht 5\' 9"  (1.753 m)   Wt 199 lb 9.6 oz (90.5 kg)   SpO2 96%   BMI 29.48 kg/m   Physical Exam  Constitutional: He is oriented to person, place, and time and well-developed, well-nourished, and in no distress. No distress.  HENT:  Right Ear: Tympanic membrane normal.  Left Ear: Tympanic  membrane normal.  Mouth/Throat: Mucous membranes are normal. Posterior oropharyngeal edema present. No oropharyngeal exudate or posterior oropharyngeal erythema.  Cardiovascular: Normal rate, regular rhythm and normal heart sounds.   Pulmonary/Chest: Effort normal. He has no decreased breath sounds. He has no wheezes. He has no rhonchi. He has no rales.  Neurological: He is alert and oriented to person, place, and time. GCS score is 15.  Skin: Skin is warm and dry.  Psychiatric: Mood, memory, affect and judgment normal.  Vitals reviewed.   Assessment and Plan :  1. Upper airway cough syndrome 2. Cough - benzonatate (TESSALON) 100 MG capsule; Take 1-2 capsules (100-200 mg total) by mouth 3 (three) times daily as needed for cough.  Dispense: 40 capsule; Refill: 0 - HYDROcodone-homatropine (HYCODAN) 5-1.5 MG/5ML syrup; Take 5 mLs by mouth every 8 (eight) hours as needed for cough.  Dispense: 120 mL; Refill: 0 - Suspect irritation post-viral illness. Encouraged humidifier in room at night, push fluids, rest. RTC in 5-7 days if no improvement.   Marco CollieWhitney Feliz Lincoln, PA-C  [facility] 10/07/2016 12:04 PM

## 2016-10-07 NOTE — Patient Instructions (Addendum)
Try placing a humidifier in your room at night while you are sleeping. Stay well hydrated, increase your water intake. Drink warm tea with honey and lemon. Come back if you are not better in 5-7 days.   Thank you for coming in today. I hope you feel we met your needs.  Feel free to call [facility] if you have any questions or further requests.  Please consider signing up for MyChart if you do not already have it, as this is a great way to communicate with me.  Best,  Whitney McVey, PA-C   IF you received an x-ray today, you will receive an invoice from [facility]. Please contact [facility] at [phone] with questions or concerns regarding your invoice.   IF you received labwork today, you will receive an invoice from [place]. Please contact [facility] at [phone] with questions or concerns regarding your invoice.   Our billing staff will not be able to assist you with questions regarding bills from these companies.  You will be contacted with the lab results as soon as they are available. The fastest way to get your results is to activate your My Chart account. Instructions are located on the last page of this paperwork. If you have not heard from [place] regarding the results in 2 weeks, please contact this office.

## 2016-11-19 ENCOUNTER — Ambulatory Visit (INDEPENDENT_AMBULATORY_CARE_PROVIDER_SITE_OTHER): Payer: 59

## 2016-11-19 ENCOUNTER — Ambulatory Visit (INDEPENDENT_AMBULATORY_CARE_PROVIDER_SITE_OTHER): Payer: 59 | Admitting: Family Medicine

## 2016-11-19 VITALS — BP 104/72 | HR 67 | Temp 97.6°F | Resp 16 | Ht 69.0 in | Wt 201.6 lb

## 2016-11-19 DIAGNOSIS — R109 Unspecified abdominal pain: Secondary | ICD-10-CM

## 2016-11-19 DIAGNOSIS — R0781 Pleurodynia: Secondary | ICD-10-CM | POA: Diagnosis not present

## 2016-11-19 DIAGNOSIS — R10813 Right lower quadrant abdominal tenderness: Secondary | ICD-10-CM | POA: Diagnosis not present

## 2016-11-19 LAB — POCT CBC
Granulocyte percent: 53.6 %G (ref 37–80)
HEMATOCRIT: 42.1 % — AB (ref 43.5–53.7)
HEMOGLOBIN: 14.7 g/dL (ref 14.1–18.1)
Lymph, poc: 2.3 (ref 0.6–3.4)
MCH: 31.1 pg (ref 27–31.2)
MCHC: 35 g/dL (ref 31.8–35.4)
MCV: 88.8 fL (ref 80–97)
MID (cbc): 0.4 (ref 0–0.9)
MPV: 7.1 fL (ref 0–99.8)
POC GRANULOCYTE: 3.2 (ref 2–6.9)
POC LYMPH PERCENT: 39 %L (ref 10–50)
POC MID %: 7.4 % (ref 0–12)
Platelet Count, POC: 223 10*3/uL (ref 142–424)
RBC: 4.74 M/uL (ref 4.69–6.13)
RDW, POC: 13 %
WBC: 5.9 10*3/uL (ref 4.6–10.2)

## 2016-11-19 LAB — POCT URINALYSIS DIP (MANUAL ENTRY)
Bilirubin, UA: NEGATIVE
Blood, UA: NEGATIVE
GLUCOSE UA: NEGATIVE
Ketones, POC UA: NEGATIVE
LEUKOCYTES UA: NEGATIVE
NITRITE UA: NEGATIVE
Protein Ur, POC: NEGATIVE
UROBILINOGEN UA: 0.2
pH, UA: 5.5

## 2016-11-19 MED ORDER — MELOXICAM 15 MG PO TABS: 15.0000 mg | ORAL_TABLET | Freq: Every day | ORAL | 1 refills | Status: DC

## 2016-11-19 NOTE — Patient Instructions (Addendum)
Start Meloxicam 15 mg once daily. If tenderness persist greater than 7 days return for care. If pain worsens or increases in pain intensity return here for care or go to the nearest Emergency Department.    IF you received an x-ray today, you will receive an invoice from [facility]. Please contact [facility] at [phone] with questions or concerns regarding your invoice.   IF you received labwork today, you will receive an invoice from [place] [facility]. Please contact [facility] at [phone] with questions or concerns regarding your invoice.   Our billing staff will not be able to assist you with questions regarding bills from these companies.  You will be contacted with the lab results as soon as they are available. The fastest way to get your results is to activate your My Chart account. Instructions are located on the last page of this paperwork. If you have not heard from [place] regarding the results in 2 weeks, please contact this office.      Muscle Pain, Adult Muscle pain (myalgia) may be mild or severe. In most cases, the pain lasts only a short time and it goes away without treatment. It is normal to feel some muscle pain after starting a workout program. Muscles that have not been used often will be sore at first. Muscle pain may also be caused by many other things, including:  Overuse or muscle strain, especially if you are not in shape. This is the most common cause of muscle pain.  Injury.  Bruises.  Viruses, such as the flu.  Infectious diseases.  A chronic condition that causes muscle tenderness, fatigue, and headache (fibromyalgia).  A condition, such as lupus, in which the body's disease-fighting system attacks other organs in the body (autoimmune or rheumatologic diseases).  Certain drugs, including ACE inhibitors and statins. To diagnose the cause of your muscle pain, your health care provider will do a physical exam and ask questions about the pain  and when it began. If you have not had muscle pain for very long, your health care provider may want to wait before doing much testing. If your muscle pain has lasted a long time, your health care provider may want to run tests right away. In some cases, this may include tests to rule out certain conditions or illnesses. Treatment for muscle pain depends on the cause. Home care is often enough to relieve muscle pain. Your health care provider may also prescribe anti-inflammatory medicine. Follow these instructions at home: Activity  If overuse is causing your muscle pain:  Slow down your activities until the pain goes away.  Do regular, gentle exercises if you are not usually active.  Warm up before exercising. Stretch before and after exercising. This can help lower the risk of muscle pain.  Do not continue working out if the pain is very bad. Bad pain could mean that you have injured a muscle. Managing pain and discomfort  If directed, apply ice to the sore muscle:  Put ice in a plastic bag.  Place a towel between your skin and the bag.  Leave the ice on for 20 minutes, 2-3 times a day.  You may also alternate between applying ice and applying heat as told by your health care provider. To apply heat, use the heat source that your health care provider recommends, such as a moist heat pack or a heating pad.  Place a towel between your skin and the heat source.  Leave the heat on for 20-30 minutes.  Remove  the heat if your skin turns bright red. This is especially important if you are unable to feel pain, heat, or cold. You may have a greater risk of getting burned. Medicines  Take over-the-counter and prescription medicines only as told by your health care provider.  Do not drive or use heavy machinery while taking prescription pain medicine. Contact a health care provider if:  Your muscle pain gets worse and medicines do not help.  You have muscle pain that lasts longer than  3 days.  You have a rash or fever along with muscle pain.  You have muscle pain after a tick bite.  You have muscle pain while working out, even though you are in good physical condition.  You have redness, soreness, or swelling along with muscle pain.  You have muscle pain after starting a new medicine or changing the dose of a medicine. Get help right away if:  You have trouble breathing.  You have trouble swallowing.  You have muscle pain along with a stiff neck, fever, and vomiting.  You have severe muscle weakness or cannot move part of your body. This information is not intended to replace advice given to you by your health care provider. Make sure you discuss any questions you have with your health care provider. Document Released: 08/20/2006 Document Revised: 04/17/2016 Document Reviewed: 02/18/2016 Elsevier Interactive Patient Education  2017 [employer]

## 2016-11-19 NOTE — Progress Notes (Signed)
Patient ID: Albertha GheeZeljko Bina, male    DOB: 04/02/1981, 36 y.o.   MRN: [id number]  PCP: No PCP Per Patient  Chief Complaint  Patient presents with  . Abdominal Pain    right flank and radiates to shoulder x 4-5 days    Subjective:  HPI 36 year old male presents for evaluation of abdominal pan x 4-5 days. Pt reports being a former smoker otherwise no significant medical history. He describes dull discomfort in RLQ, more in the upper lateral region of RLQ, and the sensation of tension on right lateral chest wall radiating up into the axilla for approximately 4-5 days. Reports ache radiates into the axilla and around to his mid back intermittently.  No known recent injuries. His occupation requires lifting.  No recent hx illness although he last  had a cough in December. Denies fever and chills. Denies urinary frequency and or urgency.  Social History   Social History  . Marital status: Divorced    Spouse name: N/A  . Number of children: N/A  . Years of education: N/A   Occupational History  . Not on file.   Social History Main Topics  . Smoking status: Former Games developermoker  . Smokeless tobacco: Never Used  . Alcohol use No  . Drug use: No  . Sexual activity: Not on file   Other Topics Concern  . Not on file   Social History Narrative  . No narrative on file    Family History  Problem Relation Age of Onset  . Hypertension Father    Review of Systems See HPI  No Known Allergies  Prior to Admission medications   Medication Sig Start Date End Date Taking? Authorizing Provider  benzonatate (TESSALON) 100 MG capsule Take 1-2 capsules (100-200 mg total) by mouth 3 (three) times daily as needed for cough. Patient not taking: Reported on 11/19/2016 10/07/16   [facility] Whitney McVey, PA-C  HYDROcodone-homatropine [facility](HYCODAN) 5-1.5 MG/5ML syrup Take 5 mLs by mouth every 8 (eight) hours as needed for cough. Patient not taking: Reported on 11/19/2016 10/07/16   Madelaine BhatElizabeth Whitney McVey,  PA-C    Past Medical, Surgical Family and Social History reviewed and updated.    Objective:   Today's Vitals   11/19/16 0808  BP: 104/72  Pulse: 67  Resp: 16  Temp: 97.6 F (36.4 C)  TempSrc: Oral  SpO2: 98%  Weight: 201 lb 9.6 oz (91.4 kg)  Height: 5\' 9"  (1.753 m)    Wt Readings from Last 3 Encounters:  11/19/16 201 lb 9.6 oz (91.4 kg)  10/07/16 199 lb 9.6 oz (90.5 kg)  08/21/16 188 lb 9.6 oz (85.5 kg)   Physical Exam  Constitutional: He is oriented to person, place, and time. He appears well-developed and well-nourished.  HENT:  Head: Normocephalic and atraumatic.  Eyes: Conjunctivae are normal. Pupils are equal, round, and reactive to light.  Neck: Normal range of motion. Neck supple.  Cardiovascular: Normal rate, regular rhythm, normal heart sounds and intact distal pulses.   Pulmonary/Chest: Effort normal and breath sounds normal.  Abdominal: Soft. Bowel sounds are normal. He exhibits no mass. There is tenderness. There is no rebound and no guarding.  Mild tenderness with (Deep Palpation only)noted below the rib cage RLQ (laterally)  Neurological: He is alert and oriented to person, place, and time.  Skin: Skin is warm and dry.  Psychiatric: He has a normal mood and affect. His behavior is normal. Judgment and thought content normal.      Assessment &  Plan:  1. Right lower quadrant abdominal tenderness without rebound tenderness 2. Rib tenderness  Plan: Normal CBC, physical exam negative for acute abdomen process, and negative CVA tenderness. Pt describes flank pain more as discomfort and it likely related to muscular skeletal myalgias. Will treat with anti-inflammatory medication. Patient instructed on symptoms or presentations that warrant further follow-up. He has verbalized understanding.  -Start Meloxicam 15 mg once daily.  -If pain persists greater than 7 days, please return for care.  Godfrey Pick. Tiburcio Pea, MSN, FNP-C Primary Care at [facility] [phone]

## 2016-12-21 ENCOUNTER — Ambulatory Visit (INDEPENDENT_AMBULATORY_CARE_PROVIDER_SITE_OTHER): Payer: 59 | Admitting: Emergency Medicine

## 2016-12-21 ENCOUNTER — Ambulatory Visit (INDEPENDENT_AMBULATORY_CARE_PROVIDER_SITE_OTHER): Payer: 59

## 2016-12-21 VITALS — BP 138/80 | HR 70 | Temp 97.9°F | Resp 18 | Ht 69.0 in | Wt 211.0 lb

## 2016-12-21 DIAGNOSIS — R079 Chest pain, unspecified: Secondary | ICD-10-CM | POA: Diagnosis not present

## 2016-12-21 DIAGNOSIS — M791 Myalgia: Secondary | ICD-10-CM | POA: Diagnosis not present

## 2016-12-21 DIAGNOSIS — M7918 Myalgia, other site: Secondary | ICD-10-CM

## 2016-12-21 NOTE — Progress Notes (Signed)
Carlos Lambert 36 y.o.   Chief Complaint  Patient presents with  . Follow-up    right side of stomach     HISTORY OF PRESENT ILLNESS: This is a 36 y.o. male complaining of pain to right side of chest x several days; denies trauma or any associated symptoms.  HPI   Prior to Admission medications   Medication Sig Start Date End Date Taking? Authorizing Provider  benzonatate (TESSALON) 100 MG capsule Take 1-2 capsules (100-200 mg total) by mouth 3 (three) times daily as needed for cough. Patient not taking: Reported on 11/19/2016 10/07/16   [facility] McVey, PA-C  HYDROcodone-homatropine [facility]) 5-1.5 MG/5ML syrup Take 5 mLs by mouth every 8 (eight) hours as needed for cough. Patient not taking: Reported on 11/19/2016 10/07/16   Madelaine Bhat McVey, PA-C  meloxicam (MOBIC) 15 MG tablet Take 1 tablet (15 mg total) by mouth daily. Patient not taking: Reported on 12/21/2016 11/19/16   Doyle Askew, FNP    No Known Allergies  There are no active problems to display for this patient.   No past medical history on file.  Past Surgical History:  Procedure Laterality Date  . KNEE SURGERY      Social History   Social History  . Marital status: Divorced    Spouse name: N/A  . Number of children: N/A  . Years of education: N/A   Occupational History  . Not on file.   Social History Main Topics  . Smoking status: Former Games developer  . Smokeless tobacco: Never Used  . Alcohol use No  . Drug use: No  . Sexual activity: Not on file   Other Topics Concern  . Not on file   Social History Narrative  . No narrative on file    Family History  Problem Relation Age of Onset  . Hypertension Father      Review of Systems  Constitutional: Negative.  Negative for chills, fever and malaise/fatigue.  HENT: Negative.  Negative for congestion, nosebleeds and sore throat.   Eyes: Negative.   Respiratory: Negative.  Negative for cough, hemoptysis and shortness of  breath.   Cardiovascular: Positive for chest pain. Negative for palpitations and leg swelling.  Gastrointestinal: Negative.  Negative for abdominal pain, diarrhea, nausea and vomiting.  Genitourinary: Negative.  Negative for dysuria and hematuria.  Musculoskeletal: Positive for back pain. Negative for joint pain, myalgias and neck pain.  Skin: Negative.  Negative for rash.  Neurological: Negative for dizziness, weakness and headaches.  Endo/Heme/Allergies: Does not bruise/bleed easily.  All other systems reviewed and are negative.  Vitals:   12/21/16 0806  BP: 138/80  Pulse: 70  Resp: 18  Temp: 97.9 F (36.6 C)    Physical Exam  Constitutional: He is oriented to person, place, and time. He appears well-developed and well-nourished.  HENT:  Head: Normocephalic and atraumatic.  Nose: Nose normal.  Mouth/Throat: Oropharynx is clear and moist.  Eyes: Conjunctivae and EOM are normal. Pupils are equal, round, and reactive to light.  Neck: Normal range of motion. Neck supple. No JVD present. No thyromegaly present.  Cardiovascular: Normal rate, regular rhythm, normal heart sounds and intact distal pulses.   Pulmonary/Chest: Effort normal and breath sounds normal. He exhibits no tenderness, no bony tenderness and no crepitus.  Abdominal: Soft. Bowel sounds are normal. He exhibits no distension. There is no tenderness.  Musculoskeletal: Normal range of motion.       Right shoulder: Normal.       Thoracic back:  Normal. He exhibits normal range of motion, no tenderness, no bony tenderness, no pain and no spasm.       Lumbar back: Normal.  Lymphadenopathy:    He has no cervical adenopathy.  Neurological: He is alert and oriented to person, place, and time. No sensory deficit. He exhibits normal muscle tone. Coordination normal.  Skin: Skin is warm and dry. Capillary refill takes less than 2 seconds.  Vitals reviewed.  CXR reviewed by me: NAD  ASSESSMENT & PLAN: Carlos Lambert was seen today  for follow-up.  Diagnoses and all orders for this visit:  Right-sided chest pain -     Comprehensive metabolic panel -     CBC with Differential/Platelet -     DG Chest 2 View; Future  Musculoskeletal pain      Edwina BarthMiguel Brenson Hartman, MD [facility]

## 2016-12-21 NOTE — Patient Instructions (Signed)
     IF you received an x-ray today, you will receive an invoice from [facility]. Please contact [facility] at [phone] with questions or concerns regarding your invoice.   IF you received labwork today, you will receive an invoice from [facility]. Please contact [facility] at [phone] with questions or concerns regarding your invoice.   Our billing staff will not be able to assist you with questions regarding bills from these companies.  You will be contacted with the lab results as soon as they are available. The fastest way to get your results is to activate your My Chart account. Instructions are located on the last page of this paperwork. If you have not heard from us regarding the results in 2 weeks, please contact this office.     

## 2016-12-22 LAB — COMPREHENSIVE METABOLIC PANEL
ALBUMIN: 4.4 g/dL (ref 3.5–5.5)
ALT: 72 IU/L — ABNORMAL HIGH (ref 0–44)
AST: 27 IU/L (ref 0–40)
Albumin/Globulin Ratio: 1.8 (ref 1.2–2.2)
Alkaline Phosphatase: 61 IU/L (ref 39–117)
BUN / CREAT RATIO: 18 (ref 9–20)
BUN: 17 mg/dL (ref 6–20)
Bilirubin Total: 0.3 mg/dL (ref 0.0–1.2)
CO2: 22 mmol/L (ref 18–29)
CREATININE: 0.95 mg/dL (ref 0.76–1.27)
Calcium: 9.1 mg/dL (ref 8.7–10.2)
Chloride: 105 mmol/L (ref 96–106)
GFR, EST AFRICAN AMERICAN: 120 mL/min/{1.73_m2} (ref >59)
GFR, EST NON AFRICAN AMERICAN: 104 mL/min/{1.73_m2} (ref >59)
GLOBULIN, TOTAL: 2.4 g/dL (ref 1.5–4.5)
GLUCOSE: 103 mg/dL — AB (ref 65–99)
Potassium: 5 mmol/L (ref 3.5–5.2)
Sodium: 145 mmol/L — ABNORMAL HIGH (ref 134–144)
TOTAL PROTEIN: 6.8 g/dL (ref 6.0–8.5)

## 2016-12-22 LAB — CBC WITH DIFFERENTIAL/PLATELET
Basophils Absolute: 0 10*3/uL (ref 0.0–0.2)
Basos: 0 %
EOS (ABSOLUTE): 0.2 10*3/uL (ref 0.0–0.4)
Eos: 3 %
HEMOGLOBIN: 14.3 g/dL (ref 13.0–17.7)
Hematocrit: 41.8 % (ref 37.5–51.0)
Immature Grans (Abs): 0 10*3/uL (ref 0.0–0.1)
Immature Granulocytes: 0 %
LYMPHS ABS: 2.3 10*3/uL (ref 0.7–3.1)
Lymphs: 33 %
MCH: 30 pg (ref 26.6–33.0)
MCHC: 34.2 g/dL (ref 31.5–35.7)
MCV: 88 fL (ref 79–97)
MONOCYTES: 10 %
Monocytes Absolute: 0.7 10*3/uL (ref 0.1–0.9)
NEUTROS ABS: 3.7 10*3/uL (ref 1.4–7.0)
Neutrophils: 54 %
Platelets: 230 10*3/uL (ref 150–379)
RBC: 4.76 x10E6/uL (ref 4.14–5.80)
RDW: 14 % (ref 12.3–15.4)
WBC: 6.9 10*3/uL (ref 3.4–10.8)

## 2016-12-25 ENCOUNTER — Encounter: Payer: Self-pay | Admitting: Emergency Medicine

## 2017-04-24 ENCOUNTER — Encounter: Payer: 59 | Admitting: Emergency Medicine

## 2017-04-26 ENCOUNTER — Ambulatory Visit (INDEPENDENT_AMBULATORY_CARE_PROVIDER_SITE_OTHER): Payer: 59

## 2017-04-26 ENCOUNTER — Encounter: Payer: Self-pay | Admitting: Emergency Medicine

## 2017-04-26 ENCOUNTER — Ambulatory Visit (INDEPENDENT_AMBULATORY_CARE_PROVIDER_SITE_OTHER): Payer: 59 | Admitting: Emergency Medicine

## 2017-04-26 VITALS — BP 93/56 | HR 71 | Temp 98.6°F | Resp 16 | Ht 69.0 in | Wt 206.8 lb

## 2017-04-26 DIAGNOSIS — Z0000 Encounter for general adult medical examination without abnormal findings: Secondary | ICD-10-CM

## 2017-04-26 NOTE — Patient Instructions (Addendum)
   IF you received an x-ray today, you will receive an invoice from [facility]. Please contact [facility] at [phone] with questions or concerns regarding your invoice.   IF you received labwork today, you will receive an invoice from [facility]. Please contact [facility] at [phone] with questions or concerns regarding your invoice.   Our billing staff will not be able to assist you with questions regarding bills from these companies.  You will be contacted with the lab results as soon as they are available. The fastest way to get your results is to activate your My Chart account. Instructions are located on the last page of this paperwork. If you have not heard from us regarding the results in 2 weeks, please contact this office.      Health Maintenance, Male A healthy lifestyle and preventive care is important for your health and wellness. Ask your health care provider about what schedule of regular examinations is right for you. What should I know about weight and diet? Eat a Healthy Diet  Eat plenty of vegetables, fruits, whole grains, low-fat dairy products, and lean protein.  Do not eat a lot of foods high in solid fats, added sugars, or salt.  Maintain a Healthy Weight Regular exercise can help you achieve or maintain a healthy weight. You should:  Do at least 150 minutes of exercise each week. The exercise should increase your heart rate and make you sweat (moderate-intensity exercise).  Do strength-training exercises at least twice a week.  Watch Your Levels of Cholesterol and Blood Lipids  Have your blood tested for lipids and cholesterol every 5 years starting at 35 years of age. If you are at high risk for heart disease, you should start having your blood tested when you are 34 years old. You may need to have your cholesterol levels checked more often if: ? Your lipid or cholesterol levels are high. ? You are older than 34 years of age. ? You  are at high risk for heart disease.  What should I know about cancer screening? Many types of cancers can be detected early and may often be prevented. Lung Cancer  You should be screened every year for lung cancer if: ? You are a current smoker who has smoked for at least 30 years. ? You are a former smoker who has quit within the past 15 years.  Talk to your health care provider about your screening options, when you should start screening, and how often you should be screened.  Colorectal Cancer  Routine colorectal cancer screening usually begins at 34 years of age and should be repeated every 5-10 years until you are 34 years old. You may need to be screened more often if early forms of precancerous polyps or small growths are found. Your health care provider may recommend screening at an earlier age if you have risk factors for colon cancer.  Your health care provider may recommend using home test kits to check for hidden blood in the stool.  A small camera at the end of a tube can be used to examine your colon (sigmoidoscopy or colonoscopy). This checks for the earliest forms of colorectal cancer.  Prostate and Testicular Cancer  Depending on your age and overall health, your health care provider may do certain tests to screen for prostate and testicular cancer.  Talk to your health care provider about any symptoms or concerns you have about testicular or prostate cancer.  Skin Cancer  Check your skin   from head to toe regularly.  Tell your health care provider about any new moles or changes in moles, especially if: ? There is a change in a mole's size, shape, or color. ? You have a mole that is larger than a pencil eraser.  Always use sunscreen. Apply sunscreen liberally and repeat throughout the day.  Protect yourself by wearing long sleeves, pants, a wide-brimmed hat, and sunglasses when outside.  What should I know about heart disease, diabetes, and high blood  pressure?  If you are 18-39 years of age, have your blood pressure checked every 3-5 years. If you are 40 years of age or older, have your blood pressure checked every year. You should have your blood pressure measured twice-once when you are at a hospital or clinic, and once when you are not at a hospital or clinic. Record the average of the two measurements. To check your blood pressure when you are not at a hospital or clinic, you can use: ? An automated blood pressure machine at a pharmacy. ? A home blood pressure monitor.  Talk to your health care provider about your target blood pressure.  If you are between 45-79 years old, ask your health care provider if you should take aspirin to prevent heart disease.  Have regular diabetes screenings by checking your fasting blood sugar level. ? If you are at a normal weight and have a low risk for diabetes, have this test once every three years after the age of 45. ? If you are overweight and have a high risk for diabetes, consider being tested at a younger age or more often.  A one-time screening for abdominal aortic aneurysm (AAA) by ultrasound is recommended for men aged 65-75 years who are current or former smokers. What should I know about preventing infection? Hepatitis B If you have a higher risk for hepatitis B, you should be screened for this virus. Talk with your health care provider to find out if you are at risk for hepatitis B infection. Hepatitis C Blood testing is recommended for:  Everyone born from 1945 through 1965.  Anyone with known risk factors for hepatitis C.  Sexually Transmitted Diseases (STDs)  You should be screened each year for STDs including gonorrhea and chlamydia if: ? You are sexually active and are younger than 34 years of age. ? You are older than 34 years of age and your health care provider tells you that you are at risk for this type of infection. ? Your sexual activity has changed since you were last  screened and you are at an increased risk for chlamydia or gonorrhea. Ask your health care provider if you are at risk.  Talk with your health care provider about whether you are at high risk of being infected with HIV. Your health care provider may recommend a prescription medicine to help prevent HIV infection.  What else can I do?  Schedule regular health, dental, and eye exams.  Stay current with your vaccines (immunizations).  Do not use any tobacco products, such as cigarettes, chewing tobacco, and e-cigarettes. If you need help quitting, ask your health care provider.  Limit alcohol intake to no more than 2 drinks per day. One drink equals 12 ounces of beer, 5 ounces of wine, or 1 ounces of hard liquor.  Do not use street drugs.  Do not share needles.  Ask your health care provider for help if you need support or information about quitting drugs.  Tell your health care   provider if you often feel depressed.  Tell your health care provider if you have ever been abused or do not feel safe at home. This information is not intended to replace advice given to you by your health care provider. Make sure you discuss any questions you have with your health care provider. Document Released: 03/26/2008 Document Revised: 05/27/2016 Document Reviewed: 07/02/2015 Elsevier Interactive Patient Education  2018 [employer]  American Heart Association (AHA) Exercise Recommendation  Being physically active is important to prevent heart disease and stroke, the nation's No. 1and No. 5killers. To improve overall cardiovascular health, we suggest at least 150 minutes per week of moderate exercise or 75 minutes per week of vigorous exercise (or a combination of moderate and vigorous activity). Thirty minutes a day, five times a week is an easy goal to remember. You will also experience benefits even if you divide your time into two or three segments of 10 to 15 minutes per day.  For people who would  benefit from lowering their blood pressure or cholesterol, we recommend 40 minutes of aerobic exercise of moderate to vigorous intensity three to four times a week to lower the risk for heart attack and stroke.  Physical activity is anything that makes you move your body and burn calories.  This includes things like climbing stairs or playing sports. Aerobic exercises benefit your heart, and include walking, jogging, swimming or biking. Strength and stretching exercises are best for overall stamina and flexibility.  The simplest, positive change you can make to effectively improve your heart health is to start walking. It's enjoyable, free, easy, social and great exercise. A walking program is flexible and boasts high success rates because people can stick with it. It's easy for walking to become a regular and satisfying part of life.   For Overall Cardiovascular Health:  At least 30 minutes of moderate-intensity aerobic activity at least 5 days per week for a total of 150  OR   At least 25 minutes of vigorous aerobic activity at least 3 days per week for a total of 75 minutes; or a combination of moderate- and vigorous-intensity aerobic activity  AND   Moderate- to high-intensity muscle-strengthening activity at least 2 days per week for additional health benefits.  For Lowering Blood Pressure and Cholesterol  An average 40 minutes of moderate- to vigorous-intensity aerobic activity 3 or 4 times per week  What if I can't make it to the time goal? Something is always better than nothing! And everyone has to start somewhere. Even if you've been sedentary for years, today is the day you can begin to make healthy changes in your life. If you don't think you'll make it for 30 or 40 minutes, set a reachable goal for today. You can work up toward your overall goal by increasing your time as you get stronger. Don't let all-or-nothing thinking rob you of doing what you can every day.   Source:http://www.heart.org    

## 2017-04-26 NOTE — Progress Notes (Signed)
Carlos Lambert 36 y.o.   Chief Complaint  Patient presents with  . Annual Exam    with chest tightness x 1 week    HISTORY OF PRESENT ILLNESS: This is a 36 y.o. male here for annual exam; has had some chest sternal pain x 1 week. No cardiac Hx.  HPI   Prior to Admission medications   Medication Sig Start Date End Date Taking? Authorizing Provider  meloxicam (MOBIC) 15 MG tablet Take 1 tablet (15 mg total) by mouth daily. 11/19/16  Yes Bing Neighbors, FNP    No Known Allergies  Patient Active Problem List   Diagnosis Date Noted  . Right-sided chest pain 12/21/2016    No past medical history on file.  Past Surgical History:  Procedure Laterality Date  . KNEE SURGERY      Social History   Social History  . Marital status: Divorced    Spouse name: N/A  . Number of children: N/A  . Years of education: N/A   Occupational History  . Not on file.   Social History Main Topics  . Smoking status: Former Games developer  . Smokeless tobacco: Never Used  . Alcohol use Yes  . Drug use: No  . Sexual activity: Not on file   Other Topics Concern  . Not on file   Social History Narrative  . No narrative on file    Family History  Problem Relation Age of Onset  . Hypertension Father   . Hyperlipidemia Father   . Cancer Mother        LUNG     Review of Systems  Constitutional: Negative.  Negative for chills and fever.  HENT: Negative.   Eyes: Negative.   Respiratory: Negative.  Negative for cough and shortness of breath.   Cardiovascular: Positive for chest pain. Negative for palpitations, orthopnea, claudication, leg swelling and PND.  Gastrointestinal: Negative.  Negative for abdominal pain, diarrhea, nausea and vomiting.  Skin: Negative.  Negative for rash.  Neurological: Negative.   Endo/Heme/Allergies: Negative.   Psychiatric/Behavioral: Negative.   All other systems reviewed and are negative.  Vitals:   04/26/17 1006  BP: (!) 93/56  Pulse: 71  Resp: 16   Temp: 98.6 F (37 C)     Physical Exam  Constitutional: He is oriented to person, place, and time. He appears well-developed and well-nourished.  HENT:  Head: Normocephalic.  Right Ear: External ear normal.  Left Ear: External ear normal.  Nose: Nose normal.  Mouth/Throat: Oropharynx is clear and moist. No oropharyngeal exudate.  Eyes: Pupils are equal, round, and reactive to light. Conjunctivae and EOM are normal.  Neck: Normal range of motion. Neck supple. No JVD present. No thyromegaly present.  Cardiovascular: Normal rate, regular rhythm, normal heart sounds and intact distal pulses.   Pulmonary/Chest: Effort normal and breath sounds normal. He exhibits tenderness (right sternal border).  Abdominal: Soft. Bowel sounds are normal. He exhibits no distension. There is no tenderness.  Musculoskeletal: Normal range of motion.  Lymphadenopathy:    He has no cervical adenopathy.  Neurological: He is alert and oriented to person, place, and time. No sensory deficit. He exhibits normal muscle tone.  Skin: Skin is warm. Capillary refill takes less than 2 seconds. No rash noted.  Psychiatric: He has a normal mood and affect. His behavior is normal.  Vitals reviewed.  Dg Chest 2 View  Result Date: 04/26/2017 CLINICAL DATA:  Routine physical exam EXAM: CHEST  2 VIEW COMPARISON:  12/21/2016 FINDINGS: Cardiac shadow  is within normal limits. The lungs are well aerated bilaterally. Bilateral nipple shadows are noted. No focal infiltrate or sizable effusion is seen. No bony abnormality is noted. IMPRESSION: No active cardiopulmonary disease. Electronically Signed   By: Alcide Clever M.D.   On: 04/26/2017 10:55     ASSESSMENT & PLAN: Amaziah was seen today for annual exam.  Diagnoses and all orders for this visit:  Routine general medical examination at a health care facility -     CBC with Differential -     Comprehensive metabolic panel -     Hemoglobin A1c -     Lipid panel -     TSH -      DG Chest 2 View; Future    Patient Instructions       IF you received an x-ray today, you will receive an invoice from [facility]. Please contact [facility] at [phone] with questions or concerns regarding your invoice.   IF you received labwork today, you will receive an invoice from [facility]. Please contact [facility] at [phone] with questions or concerns regarding your invoice.   Our billing staff will not be able to assist you with questions regarding bills from these companies.  You will be contacted with the lab results as soon as they are available. The fastest way to get your results is to activate your My Chart account. Instructions are located on the last page of this paperwork. If you have not heard from [place] regarding the results in 2 weeks, please contact this office.        Health Maintenance, Male A healthy lifestyle and preventive care is important for your health and wellness. Ask your health care provider about what schedule of regular examinations is right for you. What should I know about weight and diet? Eat a Healthy Diet  Eat plenty of vegetables, fruits, whole grains, low-fat dairy products, and lean protein.  Do not eat a lot of foods high in solid fats, added sugars, or salt.  Maintain a Healthy Weight Regular exercise can help you achieve or maintain a healthy weight. You should:  Do at least 150 minutes of exercise each week. The exercise should increase your heart rate and make you sweat (moderate-intensity exercise).  Do strength-training exercises at least twice a week.  Watch Your Levels of Cholesterol and Blood Lipids  Have your blood tested for lipids and cholesterol every 5 years starting at 34 years of age. If you are at high risk for heart disease, you should start having your blood tested when you are 34 years old. You may need to have your cholesterol levels checked more often if: ? Your lipid or cholesterol  levels are high. ? You are older than 34 years of age. ? You are at high risk for heart disease.  What should I know about cancer screening? Many types of cancers can be detected early and may often be prevented. Lung Cancer  You should be screened every year for lung cancer if: ? You are a current smoker who has smoked for at least 30 years. ? You are a former smoker who has quit within the past 15 years.  Talk to your health care provider about your screening options, when you should start screening, and how often you should be screened.  Colorectal Cancer  Routine colorectal cancer screening usually begins at 34 years of age and should be repeated every 5-10 years until you are 34 years old. You may need to  be screened more often if early forms of precancerous polyps or small growths are found. Your health care provider may recommend screening at an earlier age if you have risk factors for colon cancer.  Your health care provider may recommend using home test kits to check for hidden blood in the stool.  A small camera at the end of a tube can be used to examine your colon (sigmoidoscopy or colonoscopy). This checks for the earliest forms of colorectal cancer.  Prostate and Testicular Cancer  Depending on your age and overall health, your health care provider may do certain tests to screen for prostate and testicular cancer.  Talk to your health care provider about any symptoms or concerns you have about testicular or prostate cancer.  Skin Cancer  Check your skin from head to toe regularly.  Tell your health care provider about any new moles or changes in moles, especially if: ? There is a change in a mole's size, shape, or color. ? You have a mole that is larger than a pencil eraser.  Always use sunscreen. Apply sunscreen liberally and repeat throughout the day.  Protect yourself by wearing long sleeves, pants, a wide-brimmed hat, and sunglasses when outside.  What should  I know about heart disease, diabetes, and high blood pressure?  If you are 45-79 years of age, have your blood pressure checked every 3-5 years. If you are 32 years of age or older, have your blood pressure checked every year. You should have your blood pressure measured twice-once when you are at a hospital or clinic, and once when you are not at a hospital or clinic. Record the average of the two measurements. To check your blood pressure when you are not at a hospital or clinic, you can use: ? An automated blood pressure machine at a pharmacy. ? A home blood pressure monitor.  Talk to your health care provider about your target blood pressure.  If you are between 64-44 years old, ask your health care provider if you should take aspirin to prevent heart disease.  Have regular diabetes screenings by checking your fasting blood sugar level. ? If you are at a normal weight and have a low risk for diabetes, have this test once every three years after the age of 68. ? If you are overweight and have a high risk for diabetes, consider being tested at a younger age or more often.  A one-time screening for abdominal aortic aneurysm (AAA) by ultrasound is recommended for men aged 65-75 years who are current or former smokers. What should I know about preventing infection? Hepatitis B If you have a higher risk for hepatitis B, you should be screened for this virus. Talk with your health care provider to find out if you are at risk for hepatitis B infection. Hepatitis C Blood testing is recommended for:  Everyone born from [birth year] through 1963.  Anyone with known risk factors for hepatitis C.  Sexually Transmitted Diseases (STDs)  You should be screened each year for STDs including gonorrhea and chlamydia if: ? You are sexually active and are younger than 34 years of age. ? You are older than 34 years of age and your health care provider tells you that you are at risk for this type of  infection. ? Your sexual activity has changed since you were last screened and you are at an increased risk for chlamydia or gonorrhea. Ask your health care provider if you are at risk.  Talk with your  health care provider about whether you are at high risk of being infected with HIV. Your health care provider may recommend a prescription medicine to help prevent HIV infection.  What else can I do?  Schedule regular health, dental, and eye exams.  Stay current with your vaccines (immunizations).  Do not use any tobacco products, such as cigarettes, chewing tobacco, and e-cigarettes. If you need help quitting, ask your health care provider.  Limit alcohol intake to no more than 2 drinks per day. One drink equals 12 ounces of beer, 5 ounces of wine, or 1 ounces of hard liquor.  Do not use street drugs.  Do not share needles.  Ask your health care provider for help if you need support or information about quitting drugs.  Tell your health care provider if you often feel depressed.  Tell your health care provider if you have ever been abused or do not feel safe at home. This information is not intended to replace advice given to you by your health care provider. Make sure you discuss any questions you have with your health care provider. Document Released: 03/26/2008 Document Revised: 05/27/2016 Document Reviewed: 07/02/2015 Elsevier Interactive Patient Education  2018 [employer]  American Heart Association (AHA) Exercise Recommendation  Being physically active is important to prevent heart disease and stroke, the nation's No. 1and No. 5killers. To improve overall cardiovascular health, we suggest at least 150 minutes per week of moderate exercise or 75 minutes per week of vigorous exercise (or a combination of moderate and vigorous activity). Thirty minutes a day, five times a week is an easy goal to remember. You will also experience benefits even if you divide your time into two or  three segments of 10 to 15 minutes per day.  For people who would benefit from lowering their blood pressure or cholesterol, we recommend 40 minutes of aerobic exercise of moderate to vigorous intensity three to four times a week to lower the risk for heart attack and stroke.  Physical activity is anything that makes you move your body and burn calories.  This includes things like climbing stairs or playing sports. Aerobic exercises benefit your heart, and include walking, jogging, swimming or biking. Strength and stretching exercises are best for overall stamina and flexibility.  The simplest, positive change you can make to effectively improve your heart health is to start walking. It's enjoyable, free, easy, social and great exercise. A walking program is flexible and boasts high success rates because people can stick with it. It's easy for walking to become a regular and satisfying part of life.   For Overall Cardiovascular Health:  At least 30 minutes of moderate-intensity aerobic activity at least 5 days per week for a total of 150  OR   At least 25 minutes of vigorous aerobic activity at least 3 days per week for a total of 75 minutes; or a combination of moderate- and vigorous-intensity aerobic activity  AND   Moderate- to high-intensity muscle-strengthening activity at least 2 days per week for additional health benefits.  For Lowering Blood Pressure and Cholesterol  An average 40 minutes of moderate- to vigorous-intensity aerobic activity 3 or 4 times per week  What if I can't make it to the time goal? Something is always better than nothing! And everyone has to start somewhere. Even if you've been sedentary for years, today is the day you can begin to make healthy changes in your life. If you don't think you'll make it for 30 or  40 minutes, set a reachable goal for today. You can work up toward your overall goal by increasing your time as you get stronger. Don't let  all-or-nothing thinking rob you of doing what you can every day.  Source:[email or web address] MD [facility]

## 2017-04-27 LAB — COMPREHENSIVE METABOLIC PANEL
A/G RATIO: 2 (ref 1.2–2.2)
ALT: 30 IU/L (ref 0–44)
AST: 22 IU/L (ref 0–40)
Albumin: 4.8 g/dL (ref 3.5–5.5)
Alkaline Phosphatase: 78 IU/L (ref 39–117)
BILIRUBIN TOTAL: 0.4 mg/dL (ref 0.0–1.2)
BUN/Creatinine Ratio: 14 (ref 9–20)
BUN: 14 mg/dL (ref 6–20)
CHLORIDE: 102 mmol/L (ref 96–106)
CO2: 18 mmol/L — ABNORMAL LOW (ref 20–29)
Calcium: 9.2 mg/dL (ref 8.7–10.2)
Creatinine, Ser: 0.98 mg/dL (ref 0.76–1.27)
GFR calc Af Amer: 116 mL/min/{1.73_m2} (ref >59)
GFR calc non Af Amer: 100 mL/min/{1.73_m2} (ref >59)
Globulin, Total: 2.4 g/dL (ref 1.5–4.5)
Glucose: 95 mg/dL (ref 65–99)
POTASSIUM: 4.7 mmol/L (ref 3.5–5.2)
Sodium: 140 mmol/L (ref 134–144)
TOTAL PROTEIN: 7.2 g/dL (ref 6.0–8.5)

## 2017-04-27 LAB — HEMOGLOBIN A1C
ESTIMATED AVERAGE GLUCOSE: 120 mg/dL
HEMOGLOBIN A1C: 5.8 % — AB (ref 4.8–5.6)

## 2017-04-27 LAB — LIPID PANEL
Chol/HDL Ratio: 6.9 ratio — ABNORMAL HIGH (ref 0.0–5.0)
Cholesterol, Total: 243 mg/dL — ABNORMAL HIGH (ref 100–199)
HDL: 35 mg/dL — ABNORMAL LOW (ref >39)
LDL Calculated: 142 mg/dL — ABNORMAL HIGH (ref 0–99)
TRIGLYCERIDES: 329 mg/dL — AB (ref 0–149)
VLDL Cholesterol Cal: 66 mg/dL — ABNORMAL HIGH (ref 5–40)

## 2017-04-27 LAB — CBC WITH DIFFERENTIAL/PLATELET
BASOS ABS: 0 10*3/uL (ref 0.0–0.2)
Basos: 0 %
EOS (ABSOLUTE): 0.2 10*3/uL (ref 0.0–0.4)
Eos: 3 %
Hematocrit: 44.9 % (ref 37.5–51.0)
Hemoglobin: 15.1 g/dL (ref 13.0–17.7)
IMMATURE GRANS (ABS): 0 10*3/uL (ref 0.0–0.1)
Immature Granulocytes: 0 %
LYMPHS: 31 %
Lymphocytes Absolute: 1.8 10*3/uL (ref 0.7–3.1)
MCH: 29.9 pg (ref 26.6–33.0)
MCHC: 33.6 g/dL (ref 31.5–35.7)
MCV: 89 fL (ref 79–97)
MONOS ABS: 0.6 10*3/uL (ref 0.1–0.9)
Monocytes: 11 %
NEUTROS ABS: 3.1 10*3/uL (ref 1.4–7.0)
Neutrophils: 55 %
PLATELETS: 216 10*3/uL (ref 150–379)
RBC: 5.05 x10E6/uL (ref 4.14–5.80)
RDW: 14 % (ref 12.3–15.4)
WBC: 5.7 10*3/uL (ref 3.4–10.8)

## 2017-04-27 LAB — TSH: TSH: 1.44 u[IU]/mL (ref 0.450–4.500)

## 2017-04-28 ENCOUNTER — Other Ambulatory Visit: Payer: Self-pay | Admitting: Emergency Medicine

## 2017-04-28 DIAGNOSIS — E782 Mixed hyperlipidemia: Secondary | ICD-10-CM

## 2017-04-28 MED ORDER — PRAVASTATIN SODIUM 40 MG PO TABS: 40.0000 mg | ORAL_TABLET | Freq: Every day | ORAL | 3 refills | Status: DC

## 2017-07-13 IMAGING — DX DG CHEST 2V
2 series · 2 of 2 positions shown · non-contrast
Comparison: 11/19/2016 .  07/14/2011.  05/06/2010

CLINICAL DATA: Right-sided chest pain.

EXAM:
CHEST  2 VIEW

[chest pa]
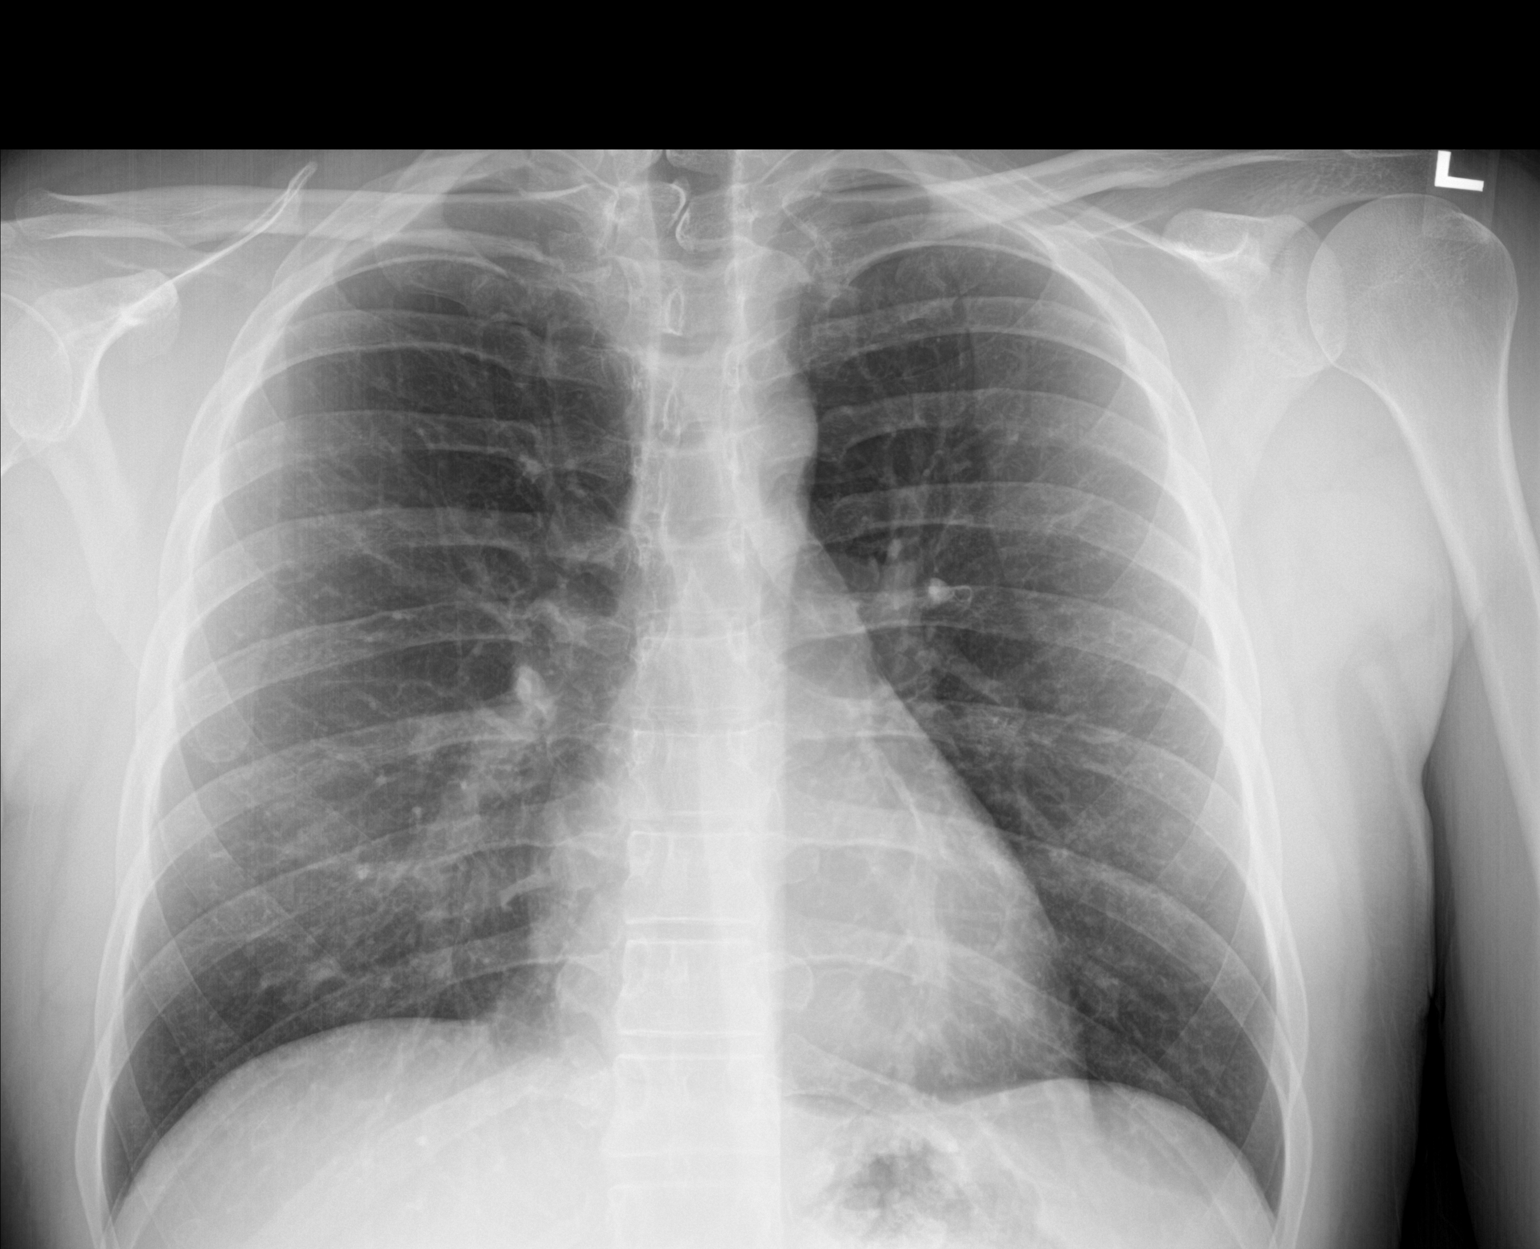

[chest lat]
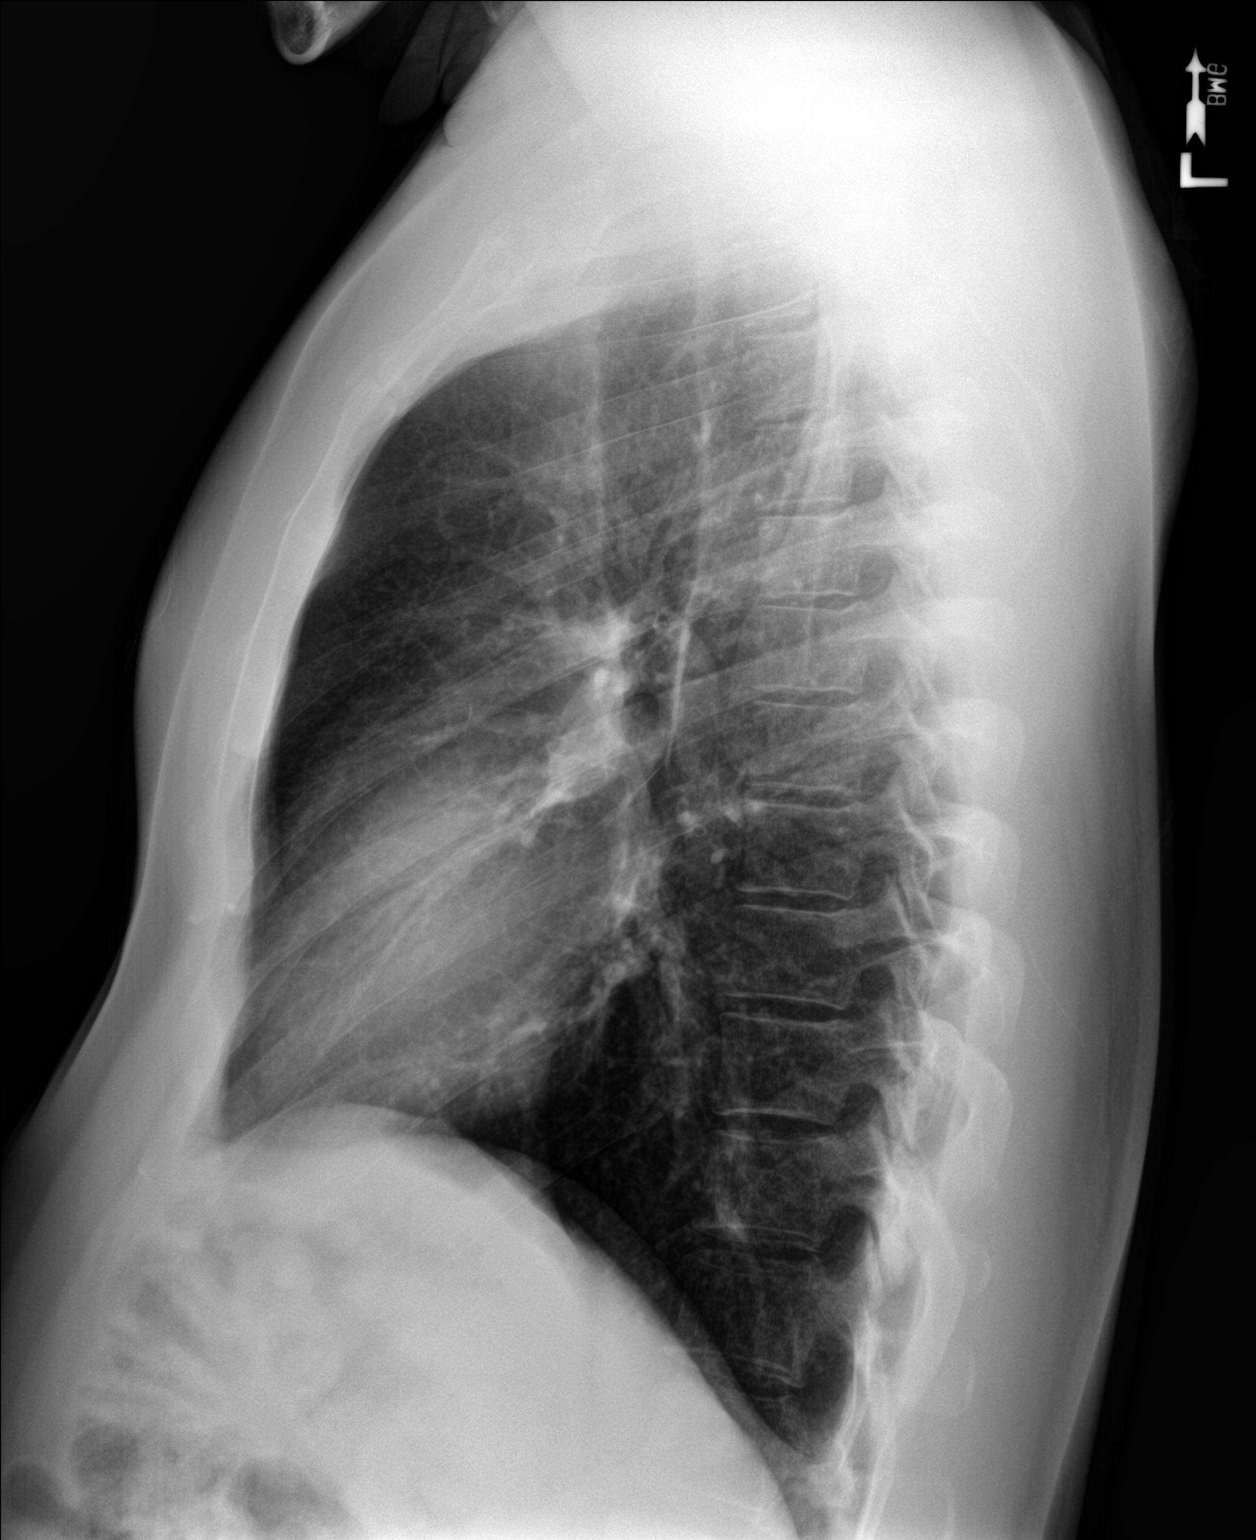

[2 of 2 positions shown; findings below may reference images not displayed]

FINDINGS: Mediastinum and hilar structures normal. Lungs are clear. No pleural
effusion or pneumothorax. No pleural effusion or pneumothorax. No
acute bony abnormality.
IMPRESSION: No acute cardiopulmonary disease.

## 2017-07-26 ENCOUNTER — Encounter: Payer: Self-pay | Admitting: Physician Assistant

## 2017-07-26 ENCOUNTER — Ambulatory Visit (INDEPENDENT_AMBULATORY_CARE_PROVIDER_SITE_OTHER): Payer: BLUE CROSS/BLUE SHIELD | Admitting: Physician Assistant

## 2017-07-26 VITALS — BP 106/62 | HR 75 | Temp 98.9°F | Resp 16 | Ht 68.5 in | Wt 203.6 lb

## 2017-07-26 DIAGNOSIS — J029 Acute pharyngitis, unspecified: Secondary | ICD-10-CM | POA: Diagnosis not present

## 2017-07-26 LAB — POCT RAPID STREP A (OFFICE): Rapid Strep A Screen: NEGATIVE

## 2017-07-26 NOTE — Progress Notes (Signed)
   Carlos Lambert  MRN: [id number] DOB: 09-09-1981  PCP: Georgina Quint, MD  Subjective:  Pt is a 36 year old male who presents to clinic sore throat. Associated symptoms include sore throat. Onset of symptoms was 3 days ago, and have been gradually improving since that time. He is drinking plenty of fluids. He has not had recent close exposure to someone with proven streptococcal pharyngitis. He has not taken anything to feel better.  Denies fever, chills, difficulty swallowing or eating.   Review of Systems  Constitutional: Negative for chills, fatigue and fever.  HENT: Positive for sore throat. Negative for congestion, mouth sores, postnasal drip, rhinorrhea, sinus pain, trouble swallowing and voice change.   Respiratory: Negative for cough, chest tightness and shortness of breath.   Skin: Negative for rash.  Psychiatric/Behavioral: Negative for sleep disturbance.    Patient Active Problem List   Diagnosis Date Noted  . Routine general medical examination at a health care facility 04/26/2017  . Right-sided chest pain 12/21/2016    Current Outpatient Prescriptions on File Prior to Visit  Medication Sig Dispense Refill  . pravastatin (PRAVACHOL) 40 MG tablet Take 1 tablet (40 mg total) by mouth daily. 90 tablet 3  . meloxicam (MOBIC) 15 MG tablet Take 1 tablet (15 mg total) by mouth daily. (Patient not taking: Reported on 07/26/2017) 30 tablet 1   No current facility-administered medications on file prior to visit.     No Known Allergies   Objective:  BP 106/62   Pulse 75   Temp 98.9 F (37.2 C) (Oral)   Resp 16   Ht 5' 8.5" (1.74 m)   Wt 203 lb 9.6 oz (92.4 kg)   SpO2 97%   BMI 30.51 kg/m   Physical Exam  Constitutional: He is oriented to person, place, and time and well-developed, well-nourished, and in no distress. No distress.  HENT:  Right Ear: Tympanic membrane normal.  Left Ear: Tympanic membrane normal.  Mouth/Throat: Uvula is midline, oropharynx is  clear and moist and mucous membranes are normal.  Cardiovascular: Normal rate, regular rhythm and normal heart sounds.   Neurological: He is alert and oriented to person, place, and time. GCS score is 15.  Skin: Skin is warm and dry.  Psychiatric: Mood, memory, affect and judgment normal.  Vitals reviewed.   Results for orders placed or performed in visit on 07/26/17  POCT rapid strep A  Result Value Ref Range   Rapid Strep A Screen Negative Negative    Assessment and Plan :  1. Viral pharyngitis 2. Sore throat - Culture, Group A Strep - POCT rapid strep A - Suspect viral pharyngitis. His clinical picture is improving. Strep culture is pending, will treat if indicated. Supportive measures discussed. RTC if symptoms worsen.    Marco Collie, PA-C  Primary [facility] at [facility] 07/26/2017 4:55 PM

## 2017-07-26 NOTE — Patient Instructions (Addendum)
Start taking Aleve for pain.  Gargle with a salt-water mixture (see below for recipe) Stay well hydrated - drink 2-3 liters of water daily.  Come back if your symptoms worsen in the next 5 days.  We will contact you if your strep culture grows bacteria and you need antibiotics.   Thank you for coming in today. I hope you feel we met your needs.  Feel free to call PCP if you have any questions or further requests.  Please consider signing up for MyChart if you do not already have it, as this is a great way to communicate with me.  Best,  Whitney McVey, PA-C   Sore Throat A sore throat is pain, burning, irritation, or scratchiness in the throat. When you have a sore throat, you may feel pain or tenderness in your throat when you swallow or talk. Many things can cause a sore throat, including:  An infection.  Seasonal allergies.  Dryness in the air.  Irritants, such as smoke or pollution.  Gastroesophageal reflux disease (GERD).  A tumor.  A sore throat is often the first sign of another sickness. It may happen with other symptoms, such as coughing, sneezing, fever, and swollen neck glands. Most sore throats go away without medical treatment. Follow these instructions at home:  Take over-the-counter medicines only as told by your health care provider.  Drink enough fluids to keep your urine clear or pale yellow.  Rest as needed.  To help with pain, try: ? Sipping warm liquids, such as broth, herbal tea, or warm water. ? Eating or drinking cold or frozen liquids, such as frozen ice pops. ? Gargling with a salt-water mixture 3-4 times a day or as needed. To make a salt-water mixture, completely dissolve -1 tsp of salt in 1 cup of warm water. ? Sucking on hard candy or throat lozenges. ? Putting a cool-mist humidifier in your bedroom at night to moisten the air. ? Sitting in the bathroom with the door closed for 5-10 minutes while you run hot water in the shower.  Do not use  any tobacco products, such as cigarettes, chewing tobacco, and e-cigarettes. If you need help quitting, ask your health care provider. Contact a health care provider if:  You have a fever for more than 2-3 days.  You have symptoms that last (are persistent) for more than 2-3 days.  Your throat does not get better within 7 days.  You have a fever and your symptoms suddenly get worse. Get help right away if:  You have difficulty breathing.  You cannot swallow fluids, soft foods, or your saliva.  You have increased swelling in your throat or neck.  You have persistent nausea and vomiting. This information is not intended to replace advice given to you by your health care provider. Make sure you discuss any questions you have with your health care provider. Document Released: 11/05/2004 Document Revised: 05/24/2016 Document Reviewed: 07/19/2015 Elsevier Interactive Patient Education  2018 [employer].   IF you received an x-ray today, you will receive an invoice from [facility]. Please contact [facility] at [phone] with questions or concerns regarding your invoice.   IF you received labwork today, you will receive an invoice from [facility]. Please contact [facility] at [phone] with questions or concerns regarding your invoice.   Our billing staff will not be able to assist you with questions regarding bills from these companies.  You will be contacted with the lab results as soon as they are available.  The fastest way to get your results is to activate your My Chart account. Instructions are located on the last page of this paperwork. If you have not heard from [place] regarding the results in 2 weeks, please contact this office.

## 2017-07-28 LAB — CULTURE, GROUP A STREP: Strep A Culture: NEGATIVE

## 2017-08-08 DIAGNOSIS — H10019 Acute follicular conjunctivitis, unspecified eye: Secondary | ICD-10-CM | POA: Diagnosis not present

## 2018-04-02 ENCOUNTER — Other Ambulatory Visit: Payer: Self-pay

## 2018-04-02 ENCOUNTER — Encounter: Payer: Self-pay | Admitting: Emergency Medicine

## 2018-04-02 ENCOUNTER — Ambulatory Visit: Payer: BLUE CROSS/BLUE SHIELD | Admitting: Emergency Medicine

## 2018-04-02 VITALS — BP 110/60 | HR 66 | Temp 98.2°F | Resp 16 | Ht 68.5 in | Wt 202.8 lb

## 2018-04-02 DIAGNOSIS — R5383 Other fatigue: Secondary | ICD-10-CM

## 2018-04-02 DIAGNOSIS — Z87898 Personal history of other specified conditions: Secondary | ICD-10-CM | POA: Diagnosis not present

## 2018-04-02 DIAGNOSIS — M791 Myalgia, unspecified site: Secondary | ICD-10-CM

## 2018-04-02 DIAGNOSIS — E782 Mixed hyperlipidemia: Secondary | ICD-10-CM | POA: Insufficient documentation

## 2018-04-02 NOTE — Progress Notes (Signed)
Shepherd Pinn 37 y.o.   Chief Complaint  Patient presents with  . Fatigue    arms in legs x 10 days     HISTORY OF PRESENT ILLNESS: This is a 37 y.o. male complaining of fatigue and discomfort to both arms and legs that lasted about 10 days and is now feeling better.  Seen by me 04/26/2017 and found to have high cholesterol and triglycerides.  Started on pravastatin but only took it for 1 month.  Not presently taking it.  No other significant symptoms.  HPI   Prior to Admission medications   Medication Sig Start Date End Date Taking? Authorizing Provider  Ascorbic Acid (VITAMIN C) 100 MG tablet Take 100 mg by mouth daily.    [provider]  meloxicam (MOBIC) 15 MG tablet Take 1 tablet (15 mg total) by mouth daily. Patient not taking: Reported on 07/26/2017 11/19/16   Bing Neighbors, FNP  pravastatin (PRAVACHOL) 40 MG tablet Take 1 tablet (40 mg total) by mouth daily. Patient not taking: Reported on 04/02/2018 04/28/17   Georgina Quint, MD    No Known Allergies  Patient Active Problem List   Diagnosis Date Noted  . Routine general medical examination at a health care facility 04/26/2017  . Right-sided chest pain 12/21/2016    History reviewed. No pertinent past medical history.  Past Surgical History:  Procedure Laterality Date  . KNEE SURGERY      Social History   Socioeconomic History  . Marital status: Divorced    Spouse name: Not on file  . Number of children: Not on file  . Years of education: Not on file  . Highest education level: Not on file  Occupational History  . Not on file  Social Needs  . Financial resource strain: Not on file  . Food insecurity:    Worry: Not on file    Inability: Not on file  . Transportation needs:    Medical: Not on file    Non-medical: Not on file  Tobacco Use  . Smoking status: Former Games developer  . Smokeless tobacco: Never Used  Substance and Sexual Activity  . Alcohol use: Yes  . Drug use: No  . Sexual  activity: Not on file  Lifestyle  . Physical activity:    Days per week: Not on file    Minutes per session: Not on file  . Stress: Not on file  Relationships  . Social connections:    Talks on phone: Not on file    Gets together: Not on file    Attends religious service: Not on file    Active member of club or organization: Not on file    Attends meetings of clubs or organizations: Not on file    Relationship status: Not on file  . Intimate partner violence:    Fear of current or ex partner: Not on file    Emotionally abused: Not on file    Physically abused: Not on file    Forced sexual activity: Not on file  Other Topics Concern  . Not on file  Social History Narrative  . Not on file    Family History  Problem Relation Age of Onset  . Hypertension Father   . Hyperlipidemia Father   . Cancer Mother        LUNG     Review of Systems  Constitutional: Positive for malaise/fatigue. Negative for chills and fever.  HENT: Negative.  Negative for congestion, ear discharge, hearing loss, nosebleeds  and sore throat.   Eyes: Negative.  Negative for blurred vision and double vision.  Respiratory: Negative.  Negative for cough and shortness of breath.   Cardiovascular: Negative.  Negative for chest pain and palpitations.  Gastrointestinal: Negative.  Negative for abdominal pain, blood in stool, diarrhea, melena, nausea and vomiting.  Genitourinary: Negative.  Negative for dysuria and hematuria.  Musculoskeletal: Negative.  Negative for back pain, myalgias and neck pain.  Skin: Negative for itching and rash.  Neurological: Negative.  Negative for dizziness and headaches.  All other systems reviewed and are negative.   Vitals:   04/02/18 1432  BP: 110/60  Pulse: 66  Resp: 16  Temp: 98.2 F (36.8 C)  SpO2: 100%    Physical Exam  Constitutional: He is oriented to person, place, and time. He appears well-developed and well-nourished.  HENT:  Head: Normocephalic and  atraumatic.  Right Ear: External ear normal.  Left Ear: External ear normal.  Nose: Nose normal.  Mouth/Throat: Oropharynx is clear and moist.  Eyes: Pupils are equal, round, and reactive to light. Conjunctivae and EOM are normal.  Neck: Normal range of motion. Neck supple.  Cardiovascular: Normal rate, regular rhythm and normal heart sounds.  Pulmonary/Chest: Effort normal and breath sounds normal.  Abdominal: Soft. Bowel sounds are normal. He exhibits no distension. There is no tenderness.  Musculoskeletal: Normal range of motion. He exhibits no edema or tenderness.  Lymphadenopathy:    He has no cervical adenopathy.  Neurological: He is alert and oriented to person, place, and time. No sensory deficit. He exhibits normal muscle tone.  Skin: Skin is warm and dry. Capillary refill takes less than 2 seconds.  Psychiatric: He has a normal mood and affect. His behavior is normal.  Vitals reviewed.   A total of 25 minutes was spent in the room with the patient, greater than 50% of which was in counseling/coordination of care regarding differential diagnosis, management, lab work, medications and need for follow-up.  ASSESSMENT & PLAN: Carlos Lambert was seen today for fatigue.  Diagnoses and all orders for this visit:  Fatigue, unspecified type -     CBC with Differential/Platelet -     Comprehensive metabolic panel  Elevated triglycerides with high cholesterol -     Lipid panel  Myalgia -     Comprehensive metabolic panel -     CK  History of prediabetes -     Hemoglobin A1c    Patient Instructions       IF you received an x-ray today, you will receive an invoice from [facility]. Please contact [facility] at [phone] with questions or concerns regarding your invoice.   IF you received labwork today, you will receive an invoice from [facility]. Please contact [facility] at [phone] with questions or concerns regarding your invoice.   Our billing staff  will not be able to assist you with questions regarding bills from these companies.  You will be contacted with the lab results as soon as they are available. The fastest way to get your results is to activate your My Chart account. Instructions are located on the last page of this paperwork. If you have not heard from [place] regarding the results in 2 weeks, please contact this office.      Fatigue Fatigue is feeling tired all of the time, a lack of energy, or a lack of motivation. Occasional or mild fatigue is often a normal response to activity or life in general. However, long-lasting (chronic) or extreme fatigue  may indicate an underlying medical condition. Follow these instructions at home: Watch your fatigue for any changes. The following actions may help to lessen any discomfort you are feeling:  Talk to your health care provider about how much sleep you need each night. Try to get the required amount every night.  Take medicines only as directed by your health care provider.  Eat a healthy and nutritious diet. Ask your health care provider if you need help changing your diet.  Drink enough fluid to keep your urine clear or pale yellow.  Practice ways of relaxing, such as yoga, meditation, massage therapy, or acupuncture.  Exercise regularly.  Change situations that cause you stress. Try to keep your work and personal routine reasonable.  Do not abuse illegal drugs.  Limit alcohol intake to no more than 1 drink per day for nonpregnant women and 2 drinks per day for men. One drink equals 12 ounces of beer, 5 ounces of wine, or 1 ounces of hard liquor.  Take a multivitamin, if directed by your health care provider.  Contact a health care provider if:  Your fatigue does not get better.  You have a fever.  You have unintentional weight loss or gain.  You have headaches.  You have difficulty: ? Falling asleep. ? Sleeping throughout the night.  You feel angry, guilty,  anxious, or sad.  You are unable to have a bowel movement (constipation).  You skin is dry.  Your legs or another part of your body is swollen. Get help right away if:  You feel confused.  Your vision is blurry.  You feel faint or pass out.  You have a severe headache.  You have severe abdominal, pelvic, or back pain.  You have chest pain, shortness of breath, or an irregular or fast heartbeat.  You are unable to urinate or you urinate less than normal.  You develop abnormal bleeding, such as bleeding from the rectum, vagina, nose, lungs, or nipples.  You vomit blood.  You have thoughts about harming yourself or committing suicide.  You are worried that you might harm someone else. This information is not intended to replace advice given to you by your health care provider. Make sure you discuss any questions you have with your health care provider. Document Released: 07/26/2007 Document Revised: 03/05/2016 Document Reviewed: 01/30/2014 Elsevier Interactive Patient Education  2018 Elsevier Inc.      Edwina BarthMiguel Shirly Bartosiewicz, MD [facility]

## 2018-04-02 NOTE — Patient Instructions (Addendum)
     IF you received an x-ray today, you will receive an invoice from [facility]. Please contact [facility] at [phone] with questions or concerns regarding your invoice.   IF you received labwork today, you will receive an invoice from [facility]. Please contact [facility] at [phone] with questions or concerns regarding your invoice.   Our billing staff will not be able to assist you with questions regarding bills from these companies.  You will be contacted with the lab results as soon as they are available. The fastest way to get your results is to activate your My Chart account. Instructions are located on the last page of this paperwork. If you have not heard from us regarding the results in 2 weeks, please contact this office.      Fatigue Fatigue is feeling tired all of the time, a lack of energy, or a lack of motivation. Occasional or mild fatigue is often a normal response to activity or life in general. However, long-lasting (chronic) or extreme fatigue may indicate an underlying medical condition. Follow these instructions at home: Watch your fatigue for any changes. The following actions may help to lessen any discomfort you are feeling:  Talk to your health care provider about how much sleep you need each night. Try to get the required amount every night.  Take medicines only as directed by your health care provider.  Eat a healthy and nutritious diet. Ask your health care provider if you need help changing your diet.  Drink enough fluid to keep your urine clear or pale yellow.  Practice ways of relaxing, such as yoga, meditation, massage therapy, or acupuncture.  Exercise regularly.  Change situations that cause you stress. Try to keep your work and personal routine reasonable.  Do not abuse illegal drugs.  Limit alcohol intake to no more than 1 drink per day for nonpregnant women and 2 drinks per day for men. One drink equals 12 ounces of  beer, 5 ounces of wine, or 1 ounces of hard liquor.  Take a multivitamin, if directed by your health care provider.  Contact a health care provider if:  Your fatigue does not get better.  You have a fever.  You have unintentional weight loss or gain.  You have headaches.  You have difficulty: ? Falling asleep. ? Sleeping throughout the night.  You feel angry, guilty, anxious, or sad.  You are unable to have a bowel movement (constipation).  You skin is dry.  Your legs or another part of your body is swollen. Get help right away if:  You feel confused.  Your vision is blurry.  You feel faint or pass out.  You have a severe headache.  You have severe abdominal, pelvic, or back pain.  You have chest pain, shortness of breath, or an irregular or fast heartbeat.  You are unable to urinate or you urinate less than normal.  You develop abnormal bleeding, such as bleeding from the rectum, vagina, nose, lungs, or nipples.  You vomit blood.  You have thoughts about harming yourself or committing suicide.  You are worried that you might harm someone else. This information is not intended to replace advice given to you by your health care provider. Make sure you discuss any questions you have with your health care provider. Document Released: 07/26/2007 Document Revised: 03/05/2016 Document Reviewed: 01/30/2014 Elsevier Interactive Patient Education  2018 Elsevier Inc.  

## 2018-04-03 LAB — COMPREHENSIVE METABOLIC PANEL
A/G RATIO: 2.1 (ref 1.2–2.2)
ALT: 28 IU/L (ref 0–44)
AST: 20 IU/L (ref 0–40)
Albumin: 4.8 g/dL (ref 3.5–5.5)
Alkaline Phosphatase: 66 IU/L (ref 39–117)
BILIRUBIN TOTAL: 0.5 mg/dL (ref 0.0–1.2)
BUN/Creatinine Ratio: 15 (ref 9–20)
BUN: 14 mg/dL (ref 6–20)
CHLORIDE: 102 mmol/L (ref 96–106)
CO2: 24 mmol/L (ref 20–29)
Calcium: 9.5 mg/dL (ref 8.7–10.2)
Creatinine, Ser: 0.94 mg/dL (ref 0.76–1.27)
GFR calc Af Amer: 121 mL/min/{1.73_m2} (ref >59)
GFR, EST NON AFRICAN AMERICAN: 105 mL/min/{1.73_m2} (ref >59)
GLOBULIN, TOTAL: 2.3 g/dL (ref 1.5–4.5)
Glucose: 88 mg/dL (ref 65–99)
POTASSIUM: 4.2 mmol/L (ref 3.5–5.2)
SODIUM: 140 mmol/L (ref 134–144)
Total Protein: 7.1 g/dL (ref 6.0–8.5)

## 2018-04-03 LAB — CBC WITH DIFFERENTIAL/PLATELET
BASOS: 0 %
Basophils Absolute: 0 10*3/uL (ref 0.0–0.2)
EOS (ABSOLUTE): 0.1 10*3/uL (ref 0.0–0.4)
EOS: 2 %
HEMATOCRIT: 43.2 % (ref 37.5–51.0)
Hemoglobin: 14.6 g/dL (ref 13.0–17.7)
Immature Grans (Abs): 0 10*3/uL (ref 0.0–0.1)
Immature Granulocytes: 0 %
LYMPHS ABS: 2.2 10*3/uL (ref 0.7–3.1)
Lymphs: 34 %
MCH: 29.3 pg (ref 26.6–33.0)
MCHC: 33.8 g/dL (ref 31.5–35.7)
MCV: 87 fL (ref 79–97)
MONOS ABS: 0.5 10*3/uL (ref 0.1–0.9)
Monocytes: 7 %
NEUTROS ABS: 3.6 10*3/uL (ref 1.4–7.0)
NEUTROS PCT: 57 %
PLATELETS: 226 10*3/uL (ref 150–450)
RBC: 4.98 x10E6/uL (ref 4.14–5.80)
RDW: 13.7 % (ref 12.3–15.4)
WBC: 6.4 10*3/uL (ref 3.4–10.8)

## 2018-04-03 LAB — LIPID PANEL
Chol/HDL Ratio: 7 ratio — ABNORMAL HIGH (ref 0.0–5.0)
Cholesterol, Total: 237 mg/dL — ABNORMAL HIGH (ref 100–199)
HDL: 34 mg/dL — AB (ref >39)
LDL Calculated: 151 mg/dL — ABNORMAL HIGH (ref 0–99)
TRIGLYCERIDES: 258 mg/dL — AB (ref 0–149)
VLDL Cholesterol Cal: 52 mg/dL — ABNORMAL HIGH (ref 5–40)

## 2018-04-03 LAB — CK: Total CK: 153 U/L (ref 24–204)

## 2018-04-03 LAB — HEMOGLOBIN A1C
Est. average glucose Bld gHb Est-mCnc: 120 mg/dL
Hgb A1c MFr Bld: 5.8 % — ABNORMAL HIGH (ref 4.8–5.6)

## 2018-04-04 ENCOUNTER — Other Ambulatory Visit: Payer: Self-pay | Admitting: Emergency Medicine

## 2018-04-04 MED ORDER — PRAVASTATIN SODIUM 40 MG PO TABS: 40.0000 mg | ORAL_TABLET | Freq: Every day | ORAL | 3 refills | Status: DC

## 2018-04-05 ENCOUNTER — Encounter: Payer: Self-pay | Admitting: *Deleted

## 2018-07-06 ENCOUNTER — Other Ambulatory Visit: Payer: Self-pay

## 2018-07-06 ENCOUNTER — Ambulatory Visit: Payer: BLUE CROSS/BLUE SHIELD | Admitting: Family Medicine

## 2018-07-06 ENCOUNTER — Encounter: Payer: Self-pay | Admitting: Family Medicine

## 2018-07-06 VITALS — BP 131/76 | HR 63 | Temp 98.4°F | Resp 20 | Ht 68.7 in | Wt 205.6 lb

## 2018-07-06 DIAGNOSIS — M722 Plantar fascial fibromatosis: Secondary | ICD-10-CM | POA: Diagnosis not present

## 2018-07-06 DIAGNOSIS — M79604 Pain in right leg: Secondary | ICD-10-CM

## 2018-07-06 MED ORDER — DICLOFENAC SODIUM 75 MG PO TBEC: 75.0000 mg | DELAYED_RELEASE_TABLET | Freq: Two times a day (BID) | ORAL | 1 refills | Status: DC

## 2018-07-06 NOTE — Patient Instructions (Addendum)
Take the diclofenac 75 mg 1 twice daily for 10 to 14 days, then get it refilled at a future time if you have recurrences.  Continue doing the stretches and exercises.  If you continue with having a a lot of trouble with this, we should probably refer you over to the sports medicine specialist at the [facility] sports medicine clinic.  Call back if you need this referral.  Tell them to look in your note that I have authorized making that referral.  Off work through the weekend.  Return as needed   Plantar Fasciitis Plantar fasciitis is a painful foot condition that affects the heel. It occurs when the band of tissue that connects the toes to the heel bone (plantar fascia) becomes irritated. This can happen after exercising too much or doing other repetitive activities (overuse injury). The pain from plantar fasciitis can range from mild irritation to severe pain that makes it difficult for you to walk or move. The pain is usually worse in the morning or after you have been sitting or lying down for a while. What are the causes? This condition may be caused by:  Standing for long periods of time.  Wearing shoes that do not fit.  Doing high-impact activities, including running, aerobics, and ballet.  Being overweight.  Having an abnormal way of walking (gait).  Having tight calf muscles.  Having high arches in your feet.  Starting a new athletic activity.  What are the signs or symptoms? The main symptom of this condition is heel pain. Other symptoms include:  Pain that gets worse after activity or exercise.  Pain that is worse in the morning or after resting.  Pain that goes away after you walk for a few minutes.  How is this diagnosed? This condition may be diagnosed based on your signs and symptoms. Your health care provider will also do a physical exam to check for:  A tender area on the bottom of your foot.  A high arch in your foot.  Pain when you move your  foot.  Difficulty moving your foot.  You may also need to have imaging studies to confirm the diagnosis. These can include:  X-rays.  Ultrasound.  MRI.  How is this treated? Treatment for plantar fasciitis depends on the severity of the condition. Your treatment may include:  Rest, ice, and over-the-counter pain medicines to manage your pain.  Exercises to stretch your calves and your plantar fascia.  A splint that holds your foot in a stretched, upward position while you sleep (night splint).  Physical therapy to relieve symptoms and prevent problems in the future.  Cortisone injections to relieve severe pain.  Extracorporeal shock wave therapy (ESWT) to stimulate damaged plantar fascia with electrical impulses. It is often used as a last resort before surgery.  Surgery, if other treatments have not worked after 12 months.  Follow these instructions at home:  Take medicines only as directed by your health care provider.  Avoid activities that cause pain.  Roll the bottom of your foot over a bag of ice or a bottle of cold water. Do this for 20 minutes, 3-4 times a day.  Perform simple stretches as directed by your health care provider.  Try wearing athletic shoes with air-sole or gel-sole cushions or soft shoe inserts.  Wear a night splint while sleeping, if directed by your health care provider.  Keep all follow-up appointments with your health care provider. How is this prevented?  Do not  perform exercises or activities that cause heel pain.  Consider finding low-impact activities if you continue to have problems.  Lose weight if you need to. The best way to prevent plantar fasciitis is to avoid the activities that aggravate your plantar fascia. Contact a health care provider if:  Your symptoms do not go away after treatment with home care measures.  Your pain gets worse.  Your pain affects your ability to move or do your daily activities. This information  is not intended to replace advice given to you by your health care provider. Make sure you discuss any questions you have with your health care provider. Document Released: 06/23/2001 Document Revised: 03/02/2016 Document Reviewed: 08/08/2014 Elsevier Interactive Patient Education  [employer].  If you have lab work done today you will be contacted with your lab results within the next 2 weeks.  If you have not heard from [place] then please contact us. The fastest way to get your results is to register for My Chart.   IF you received an x-ray today, you will receive an invoice from [facility]. Please contact [facility] at [phone] with questions or concerns regarding your invoice.   IF you received labwork today, you will receive an invoice from [place]. Please contact [facility] at [phone] with questions or concerns regarding your invoice.   Our billing staff will not be able to assist you with questions regarding bills from these companies.  You will be contacted with the lab results as soon as they are available. The fastest way to get your results is to activate your My Chart account. Instructions are located on the last page of this paperwork. If you have not heard from [place] regarding the results in 2 weeks, please contact this office.

## 2018-07-06 NOTE — Progress Notes (Signed)
Patient ID: Carlos Lambert, male    DOB: Dec 14, 1980  Age: 37 y.o. MRN: [id number]  Chief Complaint  Patient presents with  . Foot Pain    X 3 weeks- on right  foot  . Hip Pain    X 3 weeks- on right side    Subjective:   37 year old male with a history of his right foot and right hip and leg hurting.  He has not had any specific injury.  He actually has had plantar fasciitis problems for a long time, and knows to do the stretching and the icing and rolling on a can but despite that he has this flare.  He is not on any regular medicines.  The the leg pain seems to be more in the lateral hip area, nonradicular but painful down the leg.  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  He also is a single father with a 27-1/2-year-old autistic child so he has little time for himself.  No problems with medicines in the past.  Current allergies, medications, problem list, past/family and social histories reviewed.  Objective:  BP 131/76   Pulse 63   Temp 98.4 F (36.9 C) (Oral)   Resp 20   Ht 5' 8.7" (1.745 m)   Wt 205 lb 9.6 oz (93.3 kg)   SpO2 98%   BMI 30.63 kg/m   No acute distress.  Alert and oriented.  Not particularly tender in the hip.  Good range of motion.  Leg nontender.  He is tender in his right plantar area over the anterior talus.  It is not exquisitely tender.  He wears sneakers most of the time.  Assessment & Plan:   Assessment: 1. Plantar fasciitis   2. Right leg pain       Plan: Stay off work through the weekend and try and rest your tissues up.  See instructions.  No orders of the defined types were placed in this encounter.   Meds ordered this encounter  Medications  . diclofenac (VOLTAREN) 75 MG EC tablet    Sig: Take 1 tablet (75 mg total) by mouth 2 (two) times daily.    Dispense:  30 tablet    Refill:  1         Patient Instructions      Take the diclofenac 75 mg 1 twice daily for 10 to 14 days, then get it  refilled at a future time if you have recurrences.  Continue doing the stretches and exercises.  If you continue with having a a lot of trouble with this, we should probably refer you over to the sports medicine specialist at the [facility] sports medicine clinic.  Call back if you need this referral.  Tell them to look in your note that I have authorized making that referral.  Return as needed   Plantar Fasciitis Plantar fasciitis is a painful foot condition that affects the heel. It occurs when the band of tissue that connects the toes to the heel bone (plantar fascia) becomes irritated. This can happen after exercising too much or doing other repetitive activities (overuse injury). The pain from plantar fasciitis can range from mild irritation to severe pain that makes it difficult for you to walk or move. The pain is usually worse in the morning or after you have been sitting or lying down for a while. What are the causes? This condition may be caused by:  Standing for long periods of time.  Wearing shoes that do not fit.  Doing high-impact activities, including running, aerobics, and ballet.  Being overweight.  Having an abnormal way of walking (gait).  Having tight calf muscles.  Having high arches in your feet.  Starting a new athletic activity.  What are the signs or symptoms? The main symptom of this condition is heel pain. Other symptoms include:  Pain that gets worse after activity or exercise.  Pain that is worse in the morning or after resting.  Pain that goes away after you walk for a few minutes.  How is this diagnosed? This condition may be diagnosed based on your signs and symptoms. Your health care provider will also do a physical exam to check for:  A tender area on the bottom of your foot.  A high arch in your foot.  Pain when you move your foot.  Difficulty moving your foot.  You may also need to have imaging studies to confirm the diagnosis. These can  include:  X-rays.  Ultrasound.  MRI.  How is this treated? Treatment for plantar fasciitis depends on the severity of the condition. Your treatment may include:  Rest, ice, and over-the-counter pain medicines to manage your pain.  Exercises to stretch your calves and your plantar fascia.  A splint that holds your foot in a stretched, upward position while you sleep (night splint).  Physical therapy to relieve symptoms and prevent problems in the future.  Cortisone injections to relieve severe pain.  Extracorporeal shock wave therapy (ESWT) to stimulate damaged plantar fascia with electrical impulses. It is often used as a last resort before surgery.  Surgery, if other treatments have not worked after 12 months.  Follow these instructions at home:  Take medicines only as directed by your health care provider.  Avoid activities that cause pain.  Roll the bottom of your foot over a bag of ice or a bottle of cold water. Do this for 20 minutes, 3-4 times a day.  Perform simple stretches as directed by your health care provider.  Try wearing athletic shoes with air-sole or gel-sole cushions or soft shoe inserts.  Wear a night splint while sleeping, if directed by your health care provider.  Keep all follow-up appointments with your health care provider. How is this prevented?  Do not perform exercises or activities that cause heel pain.  Consider finding low-impact activities if you continue to have problems.  Lose weight if you need to. The best way to prevent plantar fasciitis is to avoid the activities that aggravate your plantar fascia. Contact a health care provider if:  Your symptoms do not go away after treatment with home care measures.  Your pain gets worse.  Your pain affects your ability to move or do your daily activities. This information is not intended to replace advice given to you by your health care provider. Make sure you discuss any questions you  have with your health care provider. Document Released: 06/23/2001 Document Revised: 03/02/2016 Document Reviewed: 08/08/2014 Elsevier Interactive Patient Education  [employer].  If you have lab work done today you will be contacted with your lab results within the next 2 weeks.  If you have not heard from [place] then please contact us. The fastest way to get your results is to register for My Chart.   IF you received an x-ray today, you will receive an invoice from [facility]. Please contact [facility] at [phone] with questions or concerns regarding your invoice.   IF you  received labwork today, you will receive an invoice from [facility]. Please contact [facility] at [phone] with questions or concerns regarding your invoice.   Our billing staff will not be able to assist you with questions regarding bills from these companies.  You will be contacted with the lab results as soon as they are available. The fastest way to get your results is to activate your My Chart account. Instructions are located on the last page of this paperwork. If you have not heard from [place] regarding the results in 2 weeks, please contact this office.        Return if symptoms worsen or fail to improve.   Janace Hoard, MD 07/06/2018

## 2018-12-15 ENCOUNTER — Encounter: Payer: Self-pay | Admitting: Emergency Medicine

## 2018-12-15 ENCOUNTER — Ambulatory Visit: Payer: Self-pay | Admitting: Emergency Medicine

## 2018-12-15 ENCOUNTER — Other Ambulatory Visit: Payer: Self-pay

## 2018-12-15 VITALS — BP 119/76 | HR 63 | Temp 98.5°F | Resp 18 | Ht 68.7 in | Wt 204.4 lb

## 2018-12-15 DIAGNOSIS — R591 Generalized enlarged lymph nodes: Secondary | ICD-10-CM

## 2018-12-15 MED ORDER — DOXYCYCLINE HYCLATE 100 MG PO TABS: 100.0000 mg | ORAL_TABLET | Freq: Two times a day (BID) | ORAL | 0 refills | Status: AC

## 2018-12-15 NOTE — Patient Instructions (Addendum)
If you have lab work done today you will be contacted with your lab results within the next 2 weeks.  If you have not heard from [place] then please contact us. The fastest way to get your results is to register for My Chart.   IF you received an x-ray today, you will receive an invoice from [facility]. Please contact [facility] at [phone] with questions or concerns regarding your invoice.   IF you received labwork today, you will receive an invoice from [facility]. Please contact [facility] at [phone] with questions or concerns regarding your invoice.   Our billing staff will not be able to assist you with questions regarding bills from these companies.  You will be contacted with the lab results as soon as they are available. The fastest way to get your results is to activate your My Chart account. Instructions are located on the last page of this paperwork. If you have not heard from [place] regarding the results in 2 weeks, please contact this office.     Lymphadenopathy  Lymphadenopathy means that your lymph glands are swollen or larger than normal (enlarged). Lymph glands, also called lymph nodes, are collections of tissue that filter bacteria, viruses, and waste from your bloodstream. They are part of your body's disease-fighting system (immune system), which protects your body from germs. There may be different causes of lymphadenopathy, depending on where it is in your body. Some types go away on their own. Lymphadenopathy can occur anywhere that you have lymph glands, including these areas:  Neck (cervical lymphadenopathy).  Chest (mediastinal lymphadenopathy).  Lungs (hilar lymphadenopathy).  Underarms (axillary lymphadenopathy).  Groin (inguinal lymphadenopathy). When your immune system responds to germs, infection-fighting cells and fluid build up in your lymph glands. This causes some swelling and enlargement. If the lymph glands do not go back to  normal after you have an infection or disease, your health care provider may do tests. These tests help to monitor your condition and find the reason why the glands are still swollen and enlarged. Follow these instructions at home:  Get plenty of rest.  Take over-the-counter and prescription medicines only as told by your health care provider. Your health care provider may recommend over-the-counter medicines for pain.  If directed, apply heat to swollen lymph glands as often as told by your health care provider. Use the heat source that your health care provider recommends, such as a moist heat pack or a heating pad. ? Place a towel between your skin and the heat source. ? Leave the heat on for 20-30 minutes. ? Remove the heat if your skin turns bright red. This is especially important if you are unable to feel pain, heat, or cold. You may have a greater risk of getting burned.  Check your affected lymph glands every day for changes. Check other lymph gland areas as told by your health care provider. Check for changes such as: ? More swelling. ? Sudden increase in size. ? Redness or pain. ? Hardness.  Keep all follow-up visits as told by your health care provider. This is important. Contact a health care provider if you have:  Swelling that gets worse or spreads to other areas.  Problems with breathing.  Lymph glands that: ? Are still swollen after 2 weeks. ? Have suddenly gotten bigger. ? Are red, painful, or hard.  A fever or chills.  Fatigue.  A sore throat.  Pain in your abdomen.  Weight loss.  Night sweats. Get  help right away if you have:  Fluid leaking from an enlarged lymph gland.  Severe pain.  Chest pain.  Shortness of breath. Summary  Lymphadenopathy means that your lymph glands are swollen or larger than normal (enlarged).  Lymph glands (also called lymph nodes) are collections of tissue that filter bacteria, viruses, and waste from the bloodstream.  They are part of your body's disease-fighting system (immune system).  Lymphadenopathy can occur anywhere that you have lymph glands.  If your enlarged and swollen lymph glands do not go back to normal after you have an infection or disease, your health care provider may do tests to monitor your condition and find the reason why the glands are still swollen and enlarged.  Check your affected lymph glands every day for changes. Check other lymph gland areas as told by your health care provider. This information is not intended to replace advice given to you by your health care provider. Make sure you discuss any questions you have with your health care provider. Document Released: 07/07/2008 Document Revised: 08/13/2017 Document Reviewed: 08/13/2017 Elsevier Interactive Patient Education  2019 [employer].

## 2018-12-15 NOTE — Progress Notes (Signed)
Carlos Lambert 38 y.o.   Chief Complaint  Patient presents with  . Groin Pain    left thigh feels like he has a swollen lymph node noticed 2 days     HISTORY OF PRESENT ILLNESS: This is a 38 y.o. male complaining of swollen lymph node to anterior left thigh, noticed 2 days ago.  Denies injury.  Denies urinary symptoms.  Denies intestinal symptoms.  Able to move bowels well.  Not sexually active.  HPI   Prior to Admission medications   Medication Sig Start Date End Date Taking? Authorizing Provider  Ascorbic Acid (VITAMIN C) 100 MG tablet Take 100 mg by mouth daily.    [provider]  diclofenac (VOLTAREN) 75 MG EC tablet Take 1 tablet (75 mg total) by mouth 2 (two) times daily. Patient not taking: Reported on 12/15/2018 07/06/18   Peyton Najjar, MD  meloxicam (MOBIC) 15 MG tablet Take 1 tablet (15 mg total) by mouth daily. Patient not taking: Reported on 07/26/2017 11/19/16   Bing Neighbors, FNP  Naproxen Sodium (ALEVE PO) Take by mouth.    [provider]  naproxen sodium (ALEVE) 220 MG tablet Take 220 mg by mouth.    [provider]  pravastatin (PRAVACHOL) 40 MG tablet Take 1 tablet (40 mg total) by mouth daily. Patient not taking: Reported on 12/15/2018 04/04/18   Georgina Quint, MD    No Known Allergies  Patient Active Problem List   Diagnosis Date Noted  . Elevated triglycerides with high cholesterol 04/02/2018  . Myalgia 04/02/2018  . Fatigue 04/02/2018  . History of prediabetes 04/02/2018  . Routine general medical examination at a health care facility 04/26/2017  . Right-sided chest pain 12/21/2016    No past medical history on file.  Past Surgical History:  Procedure Laterality Date  . KNEE SURGERY      Social History   Socioeconomic History  . Marital status: Divorced    Spouse name: Not on file  . Number of children: 1  . Years of education: Not on file  . Highest education level: Not on file  Occupational History    . Not on file  Social Needs  . Financial resource strain: Not on file  . Food insecurity:    Worry: Not on file    Inability: Not on file  . Transportation needs:    Medical: Not on file    Non-medical: Not on file  Tobacco Use  . Smoking status: Former Games developer  . Smokeless tobacco: Never Used  Substance and Sexual Activity  . Alcohol use: Yes  . Drug use: No  . Sexual activity: Not on file  Lifestyle  . Physical activity:    Days per week: Not on file    Minutes per session: Not on file  . Stress: Not on file  Relationships  . Social connections:    Talks on phone: Not on file    Gets together: Not on file    Attends religious service: Not on file    Active member of club or organization: Not on file    Attends meetings of clubs or organizations: Not on file    Relationship status: Not on file  . Intimate partner violence:    Fear of current or ex partner: Not on file    Emotionally abused: Not on file    Physically abused: Not on file    Forced sexual activity: Not on file  Other Topics Concern  . Not on  file  Social History Narrative  . Not on file    Family History  Problem Relation Age of Onset  . Hypertension Father   . Hyperlipidemia Father   . Cancer Mother        LUNG     Review of Systems  Constitutional: Negative.  Negative for chills and fever.  Respiratory: Negative for shortness of breath.   Cardiovascular: Negative for chest pain.  Gastrointestinal: Negative for abdominal pain, blood in stool, diarrhea, melena, nausea and vomiting.  Genitourinary: Negative.  Negative for dysuria, flank pain, frequency, hematuria and urgency.  Skin: Negative.  Negative for rash.  Neurological: Negative.  Negative for dizziness and headaches.  Endo/Heme/Allergies: Negative.   All other systems reviewed and are negative.  Vitals:   12/15/18 1152  BP: 119/76  Pulse: 63  Resp: 18  Temp: 98.5 F (36.9 C)  SpO2: 99%     Physical Exam Vitals signs  reviewed.  Constitutional:      Appearance: Normal appearance.  HENT:     Head: Normocephalic and atraumatic.     Mouth/Throat:     Mouth: Mucous membranes are moist.     Pharynx: Oropharynx is clear.  Eyes:     Extraocular Movements: Extraocular movements intact.     Pupils: Pupils are equal, round, and reactive to light.  Neck:     Musculoskeletal: Normal range of motion.  Cardiovascular:     Rate and Rhythm: Normal rate and regular rhythm.  Pulmonary:     Effort: Pulmonary effort is normal.  Musculoskeletal: Normal range of motion.     Comments: Left lower extremity.  Slightly tender mass felt at medial upper thigh, most likely femoral adenopathy.  Questionable hernia.  Skin:    General: Skin is warm and dry.     Capillary Refill: Capillary refill takes less than 2 seconds.  Neurological:     General: No focal deficit present.     Mental Status: He is alert and oriented to person, place, and time.      ASSESSMENT & PLAN: Deryk was seen today for groin pain.  Diagnoses and all orders for this visit:  Lymphadenopathy -     doxycycline (VIBRA-TABS) 100 MG tablet; Take 1 tablet (100 mg total) by mouth 2 (two) times daily for 7 days.    Patient Instructions       If you have lab work done today you will be contacted with your lab results within the next 2 weeks.  If you have not heard from [place] then please contact us. The fastest way to get your results is to register for My Chart.   IF you received an x-ray today, you will receive an invoice from [facility]. Please contact [facility] at [phone] with questions or concerns regarding your invoice.   IF you received labwork today, you will receive an invoice from [facility]. Please contact [facility] at [phone] with questions or concerns regarding your invoice.   Our billing staff will not be able to assist you with questions regarding bills from these companies.  You will be contacted with  the lab results as soon as they are available. The fastest way to get your results is to activate your My Chart account. Instructions are located on the last page of this paperwork. If you have not heard from [place] regarding the results in 2 weeks, please contact this office.     Lymphadenopathy  Lymphadenopathy means that your lymph glands are swollen or larger than normal (  enlarged). Lymph glands, also called lymph nodes, are collections of tissue that filter bacteria, viruses, and waste from your bloodstream. They are part of your body's disease-fighting system (immune system), which protects your body from germs. There may be different causes of lymphadenopathy, depending on where it is in your body. Some types go away on their own. Lymphadenopathy can occur anywhere that you have lymph glands, including these areas:  Neck (cervical lymphadenopathy).  Chest (mediastinal lymphadenopathy).  Lungs (hilar lymphadenopathy).  Underarms (axillary lymphadenopathy).  Groin (inguinal lymphadenopathy). When your immune system responds to germs, infection-fighting cells and fluid build up in your lymph glands. This causes some swelling and enlargement. If the lymph glands do not go back to normal after you have an infection or disease, your health care provider may do tests. These tests help to monitor your condition and find the reason why the glands are still swollen and enlarged. Follow these instructions at home:  Get plenty of rest.  Take over-the-counter and prescription medicines only as told by your health care provider. Your health care provider may recommend over-the-counter medicines for pain.  If directed, apply heat to swollen lymph glands as often as told by your health care provider. Use the heat source that your health care provider recommends, such as a moist heat pack or a heating pad. ? Place a towel between your skin and the heat source. ? Leave the heat on for 20-30  minutes. ? Remove the heat if your skin turns bright red. This is especially important if you are unable to feel pain, heat, or cold. You may have a greater risk of getting burned.  Check your affected lymph glands every day for changes. Check other lymph gland areas as told by your health care provider. Check for changes such as: ? More swelling. ? Sudden increase in size. ? Redness or pain. ? Hardness.  Keep all follow-up visits as told by your health care provider. This is important. Contact a health care provider if you have:  Swelling that gets worse or spreads to other areas.  Problems with breathing.  Lymph glands that: ? Are still swollen after 2 weeks. ? Have suddenly gotten bigger. ? Are red, painful, or hard.  A fever or chills.  Fatigue.  A sore throat.  Pain in your abdomen.  Weight loss.  Night sweats. Get help right away if you have:  Fluid leaking from an enlarged lymph gland.  Severe pain.  Chest pain.  Shortness of breath. Summary  Lymphadenopathy means that your lymph glands are swollen or larger than normal (enlarged).  Lymph glands (also called lymph nodes) are collections of tissue that filter bacteria, viruses, and waste from the bloodstream. They are part of your body's disease-fighting system (immune system).  Lymphadenopathy can occur anywhere that you have lymph glands.  If your enlarged and swollen lymph glands do not go back to normal after you have an infection or disease, your health care provider may do tests to monitor your condition and find the reason why the glands are still swollen and enlarged.  Check your affected lymph glands every day for changes. Check other lymph gland areas as told by your health care provider. This information is not intended to replace advice given to you by your health care provider. Make sure you discuss any questions you have with your health care provider. Document Released: 07/07/2008 Document  Revised: 08/13/2017 Document Reviewed: 08/13/2017 Elsevier Interactive Patient Education  2019 [employer].  Zachrey Deutscher, MD [facility] 

## 2019-01-24 ENCOUNTER — Ambulatory Visit: Payer: BLUE CROSS/BLUE SHIELD | Admitting: Emergency Medicine

## 2019-01-24 ENCOUNTER — Other Ambulatory Visit: Payer: Self-pay

## 2019-09-15 ENCOUNTER — Other Ambulatory Visit: Payer: Self-pay

## 2019-09-15 DIAGNOSIS — Z20822 Contact with and (suspected) exposure to covid-19: Secondary | ICD-10-CM

## 2019-09-17 LAB — NOVEL CORONAVIRUS, NAA: SARS-CoV-2, NAA: DETECTED — AB

## 2019-09-27 ENCOUNTER — Ambulatory Visit: Payer: BC Managed Care – PPO | Attending: Internal Medicine

## 2019-09-27 ENCOUNTER — Other Ambulatory Visit: Payer: Self-pay

## 2019-09-27 DIAGNOSIS — Z20828 Contact with and (suspected) exposure to other viral communicable diseases: Secondary | ICD-10-CM | POA: Insufficient documentation

## 2019-09-27 DIAGNOSIS — Z20822 Contact with and (suspected) exposure to covid-19: Secondary | ICD-10-CM

## 2019-09-28 NOTE — Progress Notes (Signed)
Order(s) created erroneously. Erroneous order ID: [id number]  Order moved by: Nickalas Mccarrick M  Order move date/time: 09/28/2019 6:11 PM  Source Patient: [id number]  Source Contact: 09/27/2019  Destination Patient: [id number]  Destination Contact: 09/27/2019 

## 2019-09-28 NOTE — Progress Notes (Signed)
Order(s) created erroneously. Erroneous order ID: [id number]  Order moved by: Brigitte Pulse  Order move date/time: 09/28/2019 6:11 PM  Source Patient: [id number]  Source Contact: 09/27/2019  Destination Patient: [id number]  Destination Contact: 09/27/2019

## 2019-09-28 NOTE — Progress Notes (Signed)
Moving orders to this encounter.  

## 2019-09-29 LAB — NOVEL CORONAVIRUS, NAA: SARS-CoV-2, NAA: NOT DETECTED

## 2019-11-13 ENCOUNTER — Ambulatory Visit: Payer: BC Managed Care – PPO | Attending: Internal Medicine

## 2019-11-13 ENCOUNTER — Other Ambulatory Visit: Payer: Self-pay

## 2019-11-13 DIAGNOSIS — Z20822 Contact with and (suspected) exposure to covid-19: Secondary | ICD-10-CM

## 2019-11-14 LAB — NOVEL CORONAVIRUS, NAA: SARS-CoV-2, NAA: NOT DETECTED

## 2020-02-06 DIAGNOSIS — Z03818 Encounter for observation for suspected exposure to other biological agents ruled out: Secondary | ICD-10-CM | POA: Diagnosis not present

## 2020-02-07 ENCOUNTER — Other Ambulatory Visit: Payer: Self-pay | Admitting: *Deleted

## 2020-02-07 ENCOUNTER — Telehealth: Payer: Self-pay | Admitting: Emergency Medicine

## 2020-02-07 DIAGNOSIS — E782 Mixed hyperlipidemia: Secondary | ICD-10-CM

## 2020-02-07 DIAGNOSIS — Z87898 Personal history of other specified conditions: Secondary | ICD-10-CM

## 2020-02-07 NOTE — Telephone Encounter (Signed)
Please place labs for upcoming appt. 

## 2020-02-07 NOTE — Telephone Encounter (Signed)
Labs are pended for up coming CPE on 03/05/2020, patient will come on 02/29/2020.

## 2020-02-07 NOTE — Telephone Encounter (Signed)
Pt is getting his cpe on 03/05/20, he is having his labs for his cpe on 02/29/20. Please put orders in.

## 2020-02-29 ENCOUNTER — Ambulatory Visit (INDEPENDENT_AMBULATORY_CARE_PROVIDER_SITE_OTHER): Payer: BC Managed Care – PPO | Admitting: Emergency Medicine

## 2020-02-29 ENCOUNTER — Other Ambulatory Visit: Payer: Self-pay

## 2020-02-29 DIAGNOSIS — Z87898 Personal history of other specified conditions: Secondary | ICD-10-CM | POA: Diagnosis not present

## 2020-02-29 DIAGNOSIS — E782 Mixed hyperlipidemia: Secondary | ICD-10-CM | POA: Diagnosis not present

## 2020-02-29 NOTE — Progress Notes (Signed)
Labs only visit

## 2020-03-01 LAB — COMPREHENSIVE METABOLIC PANEL
ALT: 41 IU/L (ref 0–44)
AST: 27 IU/L (ref 0–40)
Albumin/Globulin Ratio: 2 (ref 1.2–2.2)
Albumin: 4.7 g/dL (ref 4.0–5.0)
Alkaline Phosphatase: 72 IU/L (ref 48–121)
BUN/Creatinine Ratio: 12 (ref 9–20)
BUN: 14 mg/dL (ref 6–20)
Bilirubin Total: 0.5 mg/dL (ref 0.0–1.2)
CO2: 22 mmol/L (ref 20–29)
Calcium: 9.4 mg/dL (ref 8.7–10.2)
Chloride: 100 mmol/L (ref 96–106)
Creatinine, Ser: 1.13 mg/dL (ref 0.76–1.27)
GFR calc Af Amer: 95 mL/min/{1.73_m2} (ref >59)
GFR calc non Af Amer: 83 mL/min/{1.73_m2} (ref >59)
Globulin, Total: 2.3 g/dL (ref 1.5–4.5)
Glucose: 102 mg/dL — ABNORMAL HIGH (ref 65–99)
Potassium: 4.4 mmol/L (ref 3.5–5.2)
Sodium: 140 mmol/L (ref 134–144)
Total Protein: 7 g/dL (ref 6.0–8.5)

## 2020-03-01 LAB — HEMOGLOBIN A1C
Est. average glucose Bld gHb Est-mCnc: 117 mg/dL
Hgb A1c MFr Bld: 5.7 % — ABNORMAL HIGH (ref 4.8–5.6)

## 2020-03-01 LAB — LIPID PANEL
Chol/HDL Ratio: 8 ratio — ABNORMAL HIGH (ref 0.0–5.0)
Cholesterol, Total: 273 mg/dL — ABNORMAL HIGH (ref 100–199)
HDL: 34 mg/dL — ABNORMAL LOW (ref >39)
LDL Chol Calc (NIH): 183 mg/dL — ABNORMAL HIGH (ref 0–99)
Triglycerides: 289 mg/dL — ABNORMAL HIGH (ref 0–149)
VLDL Cholesterol Cal: 56 mg/dL — ABNORMAL HIGH (ref 5–40)

## 2020-03-05 ENCOUNTER — Other Ambulatory Visit: Payer: Self-pay

## 2020-03-05 ENCOUNTER — Encounter: Payer: Self-pay | Admitting: Emergency Medicine

## 2020-03-05 ENCOUNTER — Ambulatory Visit (INDEPENDENT_AMBULATORY_CARE_PROVIDER_SITE_OTHER): Payer: BC Managed Care – PPO | Admitting: Emergency Medicine

## 2020-03-05 VITALS — BP 106/67 | HR 72 | Temp 97.0°F | Resp 16 | Ht 69.0 in | Wt 217.6 lb

## 2020-03-05 DIAGNOSIS — R7303 Prediabetes: Secondary | ICD-10-CM | POA: Diagnosis not present

## 2020-03-05 DIAGNOSIS — E785 Hyperlipidemia, unspecified: Secondary | ICD-10-CM | POA: Insufficient documentation

## 2020-03-05 DIAGNOSIS — Z0001 Encounter for general adult medical examination with abnormal findings: Secondary | ICD-10-CM

## 2020-03-05 MED ORDER — ROSUVASTATIN CALCIUM 20 MG PO TABS: 20.0000 mg | ORAL_TABLET | Freq: Every day | ORAL | 3 refills | Status: DC

## 2020-03-05 NOTE — Patient Instructions (Addendum)
   If you have lab work done today you will be contacted with your lab results within the next 2 weeks.  If you have not heard from us then please contact us. The fastest way to get your results is to register for My Chart.   IF you received an x-ray today, you will receive an invoice from [facility]. Please contact [facility] at [phone] with questions or concerns regarding your invoice.   IF you received labwork today, you will receive an invoice from [facility]. Please contact [facility] at [phone] with questions or concerns regarding your invoice.   Our billing staff will not be able to assist you with questions regarding bills from these companies.  You will be contacted with the lab results as soon as they are available. The fastest way to get your results is to activate your My Chart account. Instructions are located on the last page of this paperwork. If you have not heard from us regarding the results in 2 weeks, please contact this office.     Dyslipidemia Dyslipidemia is an imbalance of waxy, fat-like substances (lipids) in the blood. The body needs lipids in small amounts. Dyslipidemia often involves a high level of cholesterol or triglycerides, which are types of lipids. Common forms of dyslipidemia include:  High levels of LDL cholesterol. LDL is the type of cholesterol that causes fatty deposits (plaques) to build up in the blood vessels that carry blood away from your heart (arteries).  Low levels of HDL cholesterol. HDL cholesterol is the type of cholesterol that protects against heart disease. High levels of HDL remove the LDL buildup from arteries.  High levels of triglycerides. Triglycerides are a fatty substance in the blood that is linked to a buildup of plaques in the arteries. What are the causes? Primary dyslipidemia is caused by changes (mutations) in genes that are passed down through families (inherited). These mutations cause several  types of dyslipidemia. Secondary dyslipidemia is caused by lifestyle choices and diseases that lead to dyslipidemia, such as:  Eating a diet that is high in animal fat.  Not getting enough exercise.  Having diabetes, kidney disease, liver disease, or thyroid disease.  Drinking large amounts of alcohol.  Using certain medicines. What increases the risk? You are more likely to develop this condition if you are an older man or if you are a woman who has gone through menopause. Other risk factors include:  Having a family history of dyslipidemia.  Taking certain medicines, including birth control pills, steroids, some diuretics, and beta-blockers.  Smoking cigarettes.  Eating a high-fat diet.  Having certain medical conditions such as diabetes, polycystic ovary syndrome (PCOS), kidney disease, liver disease, or hypothyroidism.  Not exercising regularly.  Being overweight or obese with too much belly fat. What are the signs or symptoms? In most cases, dyslipidemia does not usually cause any symptoms. In severe cases, very high lipid levels can cause:  Fatty bumps under the skin (xanthomas).  White or gray ring around the black center (pupil) of the eye. Very high triglyceride levels can cause inflammation of the pancreas (pancreatitis). How is this diagnosed? Your health care provider may diagnose dyslipidemia based on a routine blood test (fasting blood test). Because most people do not have symptoms of the condition, this blood testing (lipid profile) is done on adults age 20 and older and is repeated every 5 years. This test checks:  Total cholesterol. This measures the total amount of cholesterol in your blood, including LDL   cholesterol, HDL cholesterol, and triglycerides. A healthy number is below 200.  LDL cholesterol. The target number for LDL cholesterol is different for each person, depending on individual risk factors. Ask your health care provider what your LDL  cholesterol should be.  HDL cholesterol. An HDL level of 60 or higher is best because it helps to protect against heart disease. A number below 40 for men or below 50 for women increases the risk for heart disease.  Triglycerides. A healthy triglyceride number is below 150. If your lipid profile is abnormal, your health care provider may do other blood tests. How is this treated? Treatment depends on the type of dyslipidemia that you have and your other risk factors for heart disease and stroke. Your health care provider will have a target range for your lipid levels based on this information. For many people, this condition may be treated by lifestyle changes, such as diet and exercise. Your health care provider may recommend that you:  Get regular exercise.  Make changes to your diet.  Quit smoking if you smoke. If diet changes and exercise do not help you reach your goals, your health care provider may also prescribe medicine to lower lipids. The most commonly prescribed type of medicine lowers your LDL cholesterol (statin drug). If you have a high triglyceride level, your provider may prescribe another type of drug (fibrate) or an omega-3 fish oil supplement, or both. Follow these instructions at home:  Eating and drinking  Follow instructions from your health care provider or dietitian about eating or drinking restrictions.  Eat a healthy diet as told by your health care provider. This can help you reach and maintain a healthy weight, lower your LDL cholesterol, and raise your HDL cholesterol. This may include: ? Limiting your calories, if you are overweight. ? Eating more fruits, vegetables, whole grains, fish, and lean meats. ? Limiting saturated fat, trans fat, and cholesterol.  If you drink alcohol: ? Limit how much you use. ? Be aware of how much alcohol is in your drink. In the [place] one drink equals one 12 oz bottle of beer (355 mL), one 5 oz glass of wine (148 mL), or one 1  oz glass of hard liquor (44 mL).  Do not drink alcohol if: ? Your health care provider tells you not to drink. ? You are pregnant, may be pregnant, or are planning to become pregnant. Activity  Get regular exercise. Start an exercise and strength training program as told by your health care provider. Ask your health care provider what activities are safe for you. Your health care provider may recommend: ? 30 minutes of aerobic activity 4-6 days a week. Brisk walking is an example of aerobic activity. ? Strength training 2 days a week. General instructions  Do not use any products that contain nicotine or tobacco, such as cigarettes, e-cigarettes, and chewing tobacco. If you need help quitting, ask your health care provider.  Take over-the-counter and prescription medicines only as told by your health care provider. This includes supplements.  Keep all follow-up visits as told by your health care provider. Contact a health care provider if:  You are: ? Having trouble sticking to your exercise or diet plan. ? Struggling to quit smoking or control your use of alcohol. Summary  Dyslipidemia often involves a high level of cholesterol or triglycerides, which are types of lipids.  Treatment depends on the type of dyslipidemia that you have and your other risk factors for heart disease and   stroke.  For many people, treatment starts with lifestyle changes, such as diet and exercise.  Your health care provider may prescribe medicine to lower lipids. This information is not intended to replace advice given to you by your health care provider. Make sure you discuss any questions you have with your health care provider. Document Revised: 05/23/2018 Document Reviewed: 04/29/2018 Elsevier Patient Education  [place] Maintenance, Male Adopting a healthy lifestyle and getting preventive care are important in promoting health and wellness. Ask your health care provider  about:  The right schedule for you to have regular tests and exams.  Things you can do on your own to prevent diseases and keep yourself healthy. What should I know about diet, weight, and exercise? Eat a healthy diet   Eat a diet that includes plenty of vegetables, fruits, low-fat dairy products, and lean protein.  Do not eat a lot of foods that are high in solid fats, added sugars, or sodium. Maintain a healthy weight Body mass index (BMI) is a measurement that can be used to identify possible weight problems. It estimates body fat based on height and weight. Your health care provider can help determine your BMI and help you achieve or maintain a healthy weight. Get regular exercise Get regular exercise. This is one of the most important things you can do for your health. Most adults should:  Exercise for at least 150 minutes each week. The exercise should increase your heart rate and make you sweat (moderate-intensity exercise).  Do strengthening exercises at least twice a week. This is in addition to the moderate-intensity exercise.  Spend less time sitting. Even light physical activity can be beneficial. Watch cholesterol and blood lipids Have your blood tested for lipids and cholesterol at 37 years of age, then have this test every 5 years. You may need to have your cholesterol levels checked more often if:  Your lipid or cholesterol levels are high.  You are older than 37 years of age.  You are at high risk for heart disease. What should I know about cancer screening? Many types of cancers can be detected early and may often be prevented. Depending on your health history and family history, you may need to have cancer screening at various ages. This may include screening for:  Colorectal cancer.  Prostate cancer.  Skin cancer.  Lung cancer. What should I know about heart disease, diabetes, and high blood pressure? Blood pressure and heart disease  High blood  pressure causes heart disease and increases the risk of stroke. This is more likely to develop in people who have high blood pressure readings, are of African descent, or are overweight.  Talk with your health care provider about your target blood pressure readings.  Have your blood pressure checked: ? Every 3-5 years if you are 43-33 years of age. ? Every year if you are 29 years old or older.  If you are between the ages of 30 and 63 and are a current or former smoker, ask your health care provider if you should have a one-time screening for abdominal aortic aneurysm (AAA). Diabetes Have regular diabetes screenings. This checks your fasting blood sugar level. Have the screening done:  Once every three years after age 53 if you are at a normal weight and have a low risk for diabetes.  More often and at a younger age if you are overweight or have a high risk for diabetes. What should I know about preventing infection?  Hepatitis B If you have a higher risk for hepatitis B, you should be screened for this virus. Talk with your health care provider to find out if you are at risk for hepatitis B infection. Hepatitis C Blood testing is recommended for:  Everyone born from [birth year] through 1963.  Anyone with known risk factors for hepatitis C. Sexually transmitted infections (STIs)  You should be screened each year for STIs, including gonorrhea and chlamydia, if: ? You are sexually active and are younger than 37 years of age. ? You are older than 37 years of age and your health care provider tells you that you are at risk for this type of infection. ? Your sexual activity has changed since you were last screened, and you are at increased risk for chlamydia or gonorrhea. Ask your health care provider if you are at risk.  Ask your health care provider about whether you are at high risk for HIV. Your health care provider may recommend a prescription medicine to help prevent HIV infection. If you  choose to take medicine to prevent HIV, you should first get tested for HIV. You should then be tested every 3 months for as long as you are taking the medicine. Follow these instructions at home: Lifestyle  Do not use any products that contain nicotine or tobacco, such as cigarettes, e-cigarettes, and chewing tobacco. If you need help quitting, ask your health care provider.  Do not use street drugs.  Do not share needles.  Ask your health care provider for help if you need support or information about quitting drugs. Alcohol use  Do not drink alcohol if your health care provider tells you not to drink.  If you drink alcohol: ? Limit how much you have to 0-2 drinks a day. ? Be aware of how much alcohol is in your drink. In the [place] one drink equals one 12 oz bottle of beer (355 mL), one 5 oz glass of wine (148 mL), or one 1 oz glass of hard liquor (44 mL). General instructions  Schedule regular health, dental, and eye exams.  Stay current with your vaccines.  Tell your health care provider if: ? You often feel depressed. ? You have ever been abused or do not feel safe at home. Summary  Adopting a healthy lifestyle and getting preventive care are important in promoting health and wellness.  Follow your health care provider's instructions about healthy diet, exercising, and getting tested or screened for diseases.  Follow your health care provider's instructions on monitoring your cholesterol and blood pressure. This information is not intended to replace advice given to you by your health care provider. Make sure you discuss any questions you have with your health care provider. Document Revised: 09/21/2018 Document Reviewed: 09/21/2018 Elsevier Patient Education  2020 [employer].

## 2020-03-05 NOTE — Progress Notes (Signed)
Carlos Lambert 39 y.o.   Chief Complaint  Patient presents with  . Annual Exam    HISTORY OF PRESENT ILLNESS: This is a 39 y.o. male here for his annual exam. Has history of dyslipidemia but not taking pravastatin as recommended. Status post Covid infection last year.  Asymptomatic. Has history of prediabetes. No other chronic medical conditions.  Presently not taking any medications. Has no complaints or medical concerns today. Healthy lifestyle.  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 Recent blood work reviewed with patient.  Abnormal lipid profile.  Prediabetes.  Normal CMP. Recent Results (from the past 2160 hour(s))  Lipid panel     Status: Abnormal   Collection Time: 02/29/20  8:19 AM  Result Value Ref Range   Cholesterol, Total 273 (H) 100 - 199 mg/dL   Triglycerides 289 (H) 0 - 149 mg/dL   HDL 34 (L) >39 mg/dL   VLDL Cholesterol Cal 56 (H) 5 - 40 mg/dL   LDL Chol Calc (NIH) 183 (H) 0 - 99 mg/dL   Chol/HDL Ratio 8.0 (H) 0.0 - 5.0 ratio    Comment:                                   T. Chol/HDL Ratio                                             Men  Women                               1/2 Avg.Risk  3.4    3.3                                   Avg.Risk  5.0    4.4                                2X Avg.Risk  9.6    7.1                                3X Avg.Risk 23.4   11.0   Hemoglobin A1c     Status: Abnormal   Collection Time: 02/29/20  8:19 AM  Result Value Ref Range   Hgb A1c MFr Bld 5.7 (H) 4.8 - 5.6 %    Comment:          Prediabetes: 5.7 - 6.4          Diabetes: >6.4          Glycemic control for adults with diabetes: <7.0    Est. average glucose Bld gHb Est-mCnc 117 mg/dL  Comprehensive metabolic panel     Status: Abnormal   Collection Time: 02/29/20  8:19 AM  Result Value Ref Range   Glucose 102 (H) 65 - 99 mg/dL   BUN 14 6 - 20 mg/dL   Creatinine, Ser 1.13 0.76 - 1.27 mg/dL   GFR calc non Af Amer 83 >59 mL/min/1.73   GFR calc Af Amer 95 >59 mL/min/1.73      Comment: **[facility] currently reports eGFR in compliance with  the current**   recommendations of the [employer]. [facility] will   update reporting as new guidelines are published from the NKF-ASN   Task force.    BUN/Creatinine Ratio 12 9 - 20   Sodium 140 134 - 144 mmol/L   Potassium 4.4 3.5 - 5.2 mmol/L   Chloride 100 96 - 106 mmol/L   CO2 22 20 - 29 mmol/L   Calcium 9.4 8.7 - 10.2 mg/dL   Total Protein 7.0 6.0 - 8.5 g/dL   Albumin 4.7 4.0 - 5.0 g/dL   Globulin, Total 2.3 1.5 - 4.5 g/dL   Albumin/Globulin Ratio 2.0 1.2 - 2.2   Bilirubin Total 0.5 0.0 - 1.2 mg/dL   Alkaline Phosphatase 72 48 - 121 IU/L    Comment:               **Please note reference interval change**   AST 27 0 - 40 IU/L   ALT 41 0 - 44 IU/L     HPI   Prior to Admission medications   Medication Sig Start Date End Date Taking? Authorizing Provider  Ascorbic Acid (VITAMIN C) 100 MG tablet Take 100 mg by mouth daily.   Yes [provider]  diclofenac (VOLTAREN) 75 MG EC tablet Take 1 tablet (75 mg total) by mouth 2 (two) times daily. Patient not taking: Reported on 12/15/2018 07/06/18   Posey Boyer, MD  meloxicam (MOBIC) 15 MG tablet Take 1 tablet (15 mg total) by mouth daily. Patient not taking: Reported on 07/26/2017 11/19/16   Scot Jun, FNP  Naproxen Sodium (ALEVE PO) Take by mouth.    [provider]  naproxen sodium (ALEVE) 220 MG tablet Take 220 mg by mouth.    [provider]  pravastatin (PRAVACHOL) 40 MG tablet Take 1 tablet (40 mg total) by mouth daily. Patient not taking: Reported on 03/05/2020 04/04/18   Horald Pollen, MD    No Known Allergies  Patient Active Problem List   Diagnosis Date Noted  . Elevated triglycerides with high cholesterol 04/02/2018  . History of prediabetes 04/02/2018    History reviewed. No pertinent past medical history.  Past Surgical History:  Procedure Laterality Date  . KNEE SURGERY      Social  History   Socioeconomic History  . Marital status: Divorced    Spouse name: Not on file  . Number of children: 1  . Years of education: Not on file  . Highest education level: Not on file  Occupational History  . Not on file  Tobacco Use  . Smoking status: Former Research scientist (life sciences)  . Smokeless tobacco: Never Used  Substance and Sexual Activity  . Alcohol use: Yes  . Drug use: No  . Sexual activity: Not on file  Other Topics Concern  . Not on file  Social History Narrative  . Not on file   Social Determinants of Health   Financial Resource Strain:   . Difficulty of Paying Living Expenses:   Food Insecurity:   . Worried About Charity fundraiser in the Last Year:   . Arboriculturist in the Last Year:   Transportation Needs:   . Film/video editor (Medical):   Marland Kitchen Lack of Transportation (Non-Medical):   Physical Activity:   . Days of Exercise per Week:   . Minutes of Exercise per Session:   Stress:   . Feeling of Stress :   Social Connections:   . Frequency of Communication with Friends and  Family:   . Frequency of Social Gatherings with Friends and Family:   . Attends Religious Services:   . Active Member of Clubs or Organizations:   . Attends Archivist Meetings:   Marland Kitchen Marital Status:   Intimate Partner Violence:   . Fear of Current or Ex-Partner:   . Emotionally Abused:   Marland Kitchen Physically Abused:   . Sexually Abused:     Family History  Problem Relation Age of Onset  . Hypertension Father   . Hyperlipidemia Father   . Cancer Mother        LUNG     Review of Systems  Constitutional: Negative.  Negative for chills and fever.  HENT: Negative.  Negative for congestion.   Eyes: Negative.   Respiratory: Negative.  Negative for cough and shortness of breath.   Cardiovascular: Negative.  Negative for chest pain and palpitations.  Gastrointestinal: Negative.  Negative for abdominal pain, blood in stool, diarrhea, nausea and vomiting.  Genitourinary: Negative.   Negative for dysuria, frequency and hematuria.  Musculoskeletal: Negative for back pain, myalgias and neck pain.       Chronic pain to right leg from knee surgery in the past  Skin: Negative.  Negative for rash.  Neurological: Negative.  Negative for dizziness and headaches.  All other systems reviewed and are negative.   Today's Vitals   03/05/20 0805  BP: 106/67  Pulse: 72  Resp: 16  Temp: (!) 97 F (36.1 C)  TempSrc: Temporal  SpO2: 97%  Weight: 217 lb 9.6 oz (98.7 kg)  Height: 5' 9"  (1.753 m)   Body mass index is 32.13 kg/m.  Physical Exam Vitals reviewed.  Constitutional:      Appearance: Normal appearance.  HENT:     Head: Normocephalic.  Eyes:     Extraocular Movements: Extraocular movements intact.     Conjunctiva/sclera: Conjunctivae normal.     Pupils: Pupils are equal, round, and reactive to light.  Cardiovascular:     Rate and Rhythm: Normal rate and regular rhythm.     Pulses: Normal pulses.     Heart sounds: Normal heart sounds.  Pulmonary:     Effort: Pulmonary effort is normal.     Breath sounds: Normal breath sounds.  Abdominal:     General: Bowel sounds are normal. There is no distension.     Palpations: Abdomen is soft. There is no mass.     Tenderness: There is no abdominal tenderness. There is no right CVA tenderness, left CVA tenderness or guarding.     Hernia: No hernia is present.  Musculoskeletal:        General: Normal range of motion.     Cervical back: Normal range of motion and neck supple.  Skin:    General: Skin is warm and dry.     Capillary Refill: Capillary refill takes less than 2 seconds.  Neurological:     General: No focal deficit present.     Mental Status: He is alert and oriented to person, place, and time.  Psychiatric:        Mood and Affect: Mood normal.        Behavior: Behavior normal.      ASSESSMENT & PLAN: Yostin was seen today for annual exam.  Diagnoses and all orders for this visit:  Encounter for  general adult medical examination with abnormal findings  Dyslipidemia -     rosuvastatin (CRESTOR) 20 MG tablet; Take 1 tablet (20 mg total) by mouth daily.  Prediabetes    Patient Instructions       If you have lab work done today you will be contacted with your lab results within the next 2 weeks.  If you have not heard from [place] then please contact us. The fastest way to get your results is to register for My Chart.   IF you received an x-ray today, you will receive an invoice from [facility]. Please contact [facility] at [phone] with questions or concerns regarding your invoice.   IF you received labwork today, you will receive an invoice from [facility]. Please contact [facility] at [phone] with questions or concerns regarding your invoice.   Our billing staff will not be able to assist you with questions regarding bills from these companies.  You will be contacted with the lab results as soon as they are available. The fastest way to get your results is to activate your My Chart account. Instructions are located on the last page of this paperwork. If you have not heard from [place] regarding the results in 2 weeks, please contact this office.     Dyslipidemia Dyslipidemia is an imbalance of waxy, fat-like substances (lipids) in the blood. The body needs lipids in small amounts. Dyslipidemia often involves a high level of cholesterol or triglycerides, which are types of lipids. Common forms of dyslipidemia include:  High levels of LDL cholesterol. LDL is the type of cholesterol that causes fatty deposits (plaques) to build up in the blood vessels that carry blood away from your heart (arteries).  Low levels of HDL cholesterol. HDL cholesterol is the type of cholesterol that protects against heart disease. High levels of HDL remove the LDL buildup from arteries.  High levels of triglycerides. Triglycerides are a fatty substance in the blood that is linked  to a buildup of plaques in the arteries. What are the causes? Primary dyslipidemia is caused by changes (mutations) in genes that are passed down through families (inherited). These mutations cause several types of dyslipidemia. Secondary dyslipidemia is caused by lifestyle choices and diseases that lead to dyslipidemia, such as:  Eating a diet that is high in animal fat.  Not getting enough exercise.  Having diabetes, kidney disease, liver disease, or thyroid disease.  Drinking large amounts of alcohol.  Using certain medicines. What increases the risk? You are more likely to develop this condition if you are an older man or if you are a woman who has gone through menopause. Other risk factors include:  Having a family history of dyslipidemia.  Taking certain medicines, including birth control pills, steroids, some diuretics, and beta-blockers.  Smoking cigarettes.  Eating a high-fat diet.  Having certain medical conditions such as diabetes, polycystic ovary syndrome (PCOS), kidney disease, liver disease, or hypothyroidism.  Not exercising regularly.  Being overweight or obese with too much belly fat. What are the signs or symptoms? In most cases, dyslipidemia does not usually cause any symptoms. In severe cases, very high lipid levels can cause:  Fatty bumps under the skin (xanthomas).  White or gray ring around the black center (pupil) of the eye. Very high triglyceride levels can cause inflammation of the pancreas (pancreatitis). How is this diagnosed? Your health care provider may diagnose dyslipidemia based on a routine blood test (fasting blood test). Because most people do not have symptoms of the condition, this blood testing (lipid profile) is done on adults age 29 and older and is repeated every 5 years. This test checks:  Total cholesterol. This measures the  total amount of cholesterol in your blood, including LDL cholesterol, HDL cholesterol, and triglycerides. A  healthy number is below 200.  LDL cholesterol. The target number for LDL cholesterol is different for each person, depending on individual risk factors. Ask your health care provider what your LDL cholesterol should be.  HDL cholesterol. An HDL level of 60 or higher is best because it helps to protect against heart disease. A number below 71 for men or below 17 for women increases the risk for heart disease.  Triglycerides. A healthy triglyceride number is below 150. If your lipid profile is abnormal, your health care provider may do other blood tests. How is this treated? Treatment depends on the type of dyslipidemia that you have and your other risk factors for heart disease and stroke. Your health care provider will have a target range for your lipid levels based on this information. For many people, this condition may be treated by lifestyle changes, such as diet and exercise. Your health care provider may recommend that you:  Get regular exercise.  Make changes to your diet.  Quit smoking if you smoke. If diet changes and exercise do not help you reach your goals, your health care provider may also prescribe medicine to lower lipids. The most commonly prescribed type of medicine lowers your LDL cholesterol (statin drug). If you have a high triglyceride level, your provider may prescribe another type of drug (fibrate) or an omega-3 fish oil supplement, or both. Follow these instructions at home:  Eating and drinking  Follow instructions from your health care provider or dietitian about eating or drinking restrictions.  Eat a healthy diet as told by your health care provider. This can help you reach and maintain a healthy weight, lower your LDL cholesterol, and raise your HDL cholesterol. This may include: ? Limiting your calories, if you are overweight. ? Eating more fruits, vegetables, whole grains, fish, and lean meats. ? Limiting saturated fat, trans fat, and cholesterol.  If you  drink alcohol: ? Limit how much you use. ? Be aware of how much alcohol is in your drink. In the [place] one drink equals one 12 oz bottle of beer (355 mL), one 5 oz glass of wine (148 mL), or one 1 oz glass of hard liquor (44 mL).  Do not drink alcohol if: ? Your health care provider tells you not to drink. ? You are pregnant, may be pregnant, or are planning to become pregnant. Activity  Get regular exercise. Start an exercise and strength training program as told by your health care provider. Ask your health care provider what activities are safe for you. Your health care provider may recommend: ? 30 minutes of aerobic activity 4-6 days a week. Brisk walking is an example of aerobic activity. ? Strength training 2 days a week. General instructions  Do not use any products that contain nicotine or tobacco, such as cigarettes, e-cigarettes, and chewing tobacco. If you need help quitting, ask your health care provider.  Take over-the-counter and prescription medicines only as told by your health care provider. This includes supplements.  Keep all follow-up visits as told by your health care provider. Contact a health care provider if:  You are: ? Having trouble sticking to your exercise or diet plan. ? Struggling to quit smoking or control your use of alcohol. Summary  Dyslipidemia often involves a high level of cholesterol or triglycerides, which are types of lipids.  Treatment depends on the type of dyslipidemia that you have  and your other risk factors for heart disease and stroke.  For many people, treatment starts with lifestyle changes, such as diet and exercise.  Your health care provider may prescribe medicine to lower lipids. This information is not intended to replace advice given to you by your health care provider. Make sure you discuss any questions you have with your health care provider. Document Revised: 05/23/2018 Document Reviewed: 04/29/2018 Elsevier Patient  Education  [place] Maintenance, Male Adopting a healthy lifestyle and getting preventive care are important in promoting health and wellness. Ask your health care provider about:  The right schedule for you to have regular tests and exams.  Things you can do on your own to prevent diseases and keep yourself healthy. What should I know about diet, weight, and exercise? Eat a healthy diet   Eat a diet that includes plenty of vegetables, fruits, low-fat dairy products, and lean protein.  Do not eat a lot of foods that are high in solid fats, added sugars, or sodium. Maintain a healthy weight Body mass index (BMI) is a measurement that can be used to identify possible weight problems. It estimates body fat based on height and weight. Your health care provider can help determine your BMI and help you achieve or maintain a healthy weight. Get regular exercise Get regular exercise. This is one of the most important things you can do for your health. Most adults should:  Exercise for at least 150 minutes each week. The exercise should increase your heart rate and make you sweat (moderate-intensity exercise).  Do strengthening exercises at least twice a week. This is in addition to the moderate-intensity exercise.  Spend less time sitting. Even light physical activity can be beneficial. Watch cholesterol and blood lipids Have your blood tested for lipids and cholesterol at 37 years of age, then have this test every 5 years. You may need to have your cholesterol levels checked more often if:  Your lipid or cholesterol levels are high.  You are older than 37 years of age.  You are at high risk for heart disease. What should I know about cancer screening? Many types of cancers can be detected early and may often be prevented. Depending on your health history and family history, you may need to have cancer screening at various ages. This may include screening  for:  Colorectal cancer.  Prostate cancer.  Skin cancer.  Lung cancer. What should I know about heart disease, diabetes, and high blood pressure? Blood pressure and heart disease  High blood pressure causes heart disease and increases the risk of stroke. This is more likely to develop in people who have high blood pressure readings, are of African descent, or are overweight.  Talk with your health care provider about your target blood pressure readings.  Have your blood pressure checked: ? Every 3-5 years if you are 25-11 years of age. ? Every year if you are 51 years old or older.  If you are between the ages of 23 and 87 and are a current or former smoker, ask your health care provider if you should have a one-time screening for abdominal aortic aneurysm (AAA). Diabetes Have regular diabetes screenings. This checks your fasting blood sugar level. Have the screening done:  Once every three years after age 58 if you are at a normal weight and have a low risk for diabetes.  More often and at a younger age if you are overweight or have a high risk  for diabetes. What should I know about preventing infection? Hepatitis B If you have a higher risk for hepatitis B, you should be screened for this virus. Talk with your health care provider to find out if you are at risk for hepatitis B infection. Hepatitis C Blood testing is recommended for:  Everyone born from [birth year] through 1963.  Anyone with known risk factors for hepatitis C. Sexually transmitted infections (STIs)  You should be screened each year for STIs, including gonorrhea and chlamydia, if: ? You are sexually active and are younger than 37 years of age. ? You are older than 37 years of age and your health care provider tells you that you are at risk for this type of infection. ? Your sexual activity has changed since you were last screened, and you are at increased risk for chlamydia or gonorrhea. Ask your health care  provider if you are at risk.  Ask your health care provider about whether you are at high risk for HIV. Your health care provider may recommend a prescription medicine to help prevent HIV infection. If you choose to take medicine to prevent HIV, you should first get tested for HIV. You should then be tested every 3 months for as long as you are taking the medicine. Follow these instructions at home: Lifestyle  Do not use any products that contain nicotine or tobacco, such as cigarettes, e-cigarettes, and chewing tobacco. If you need help quitting, ask your health care provider.  Do not use street drugs.  Do not share needles.  Ask your health care provider for help if you need support or information about quitting drugs. Alcohol use  Do not drink alcohol if your health care provider tells you not to drink.  If you drink alcohol: ? Limit how much you have to 0-2 drinks a day. ? Be aware of how much alcohol is in your drink. In the [place] one drink equals one 12 oz bottle of beer (355 mL), one 5 oz glass of wine (148 mL), or one 1 oz glass of hard liquor (44 mL). General instructions  Schedule regular health, dental, and eye exams.  Stay current with your vaccines.  Tell your health care provider if: ? You often feel depressed. ? You have ever been abused or do not feel safe at home. Summary  Adopting a healthy lifestyle and getting preventive care are important in promoting health and wellness.  Follow your health care provider's instructions about healthy diet, exercising, and getting tested or screened for diseases.  Follow your health care provider's instructions on monitoring your cholesterol and blood pressure. This information is not intended to replace advice given to you by your health care provider. Make sure you discuss any questions you have with your health care provider. Document Revised: 09/21/2018 Document Reviewed: 09/21/2018 Elsevier Patient Education  2020  [employer]      Agustina Caroli, MD [facility]

## 2020-05-28 ENCOUNTER — Other Ambulatory Visit: Payer: Self-pay

## 2020-05-28 ENCOUNTER — Encounter: Payer: Self-pay | Admitting: Emergency Medicine

## 2020-05-28 ENCOUNTER — Ambulatory Visit (INDEPENDENT_AMBULATORY_CARE_PROVIDER_SITE_OTHER): Payer: BC Managed Care – PPO | Admitting: Emergency Medicine

## 2020-05-28 VITALS — BP 120/75 | HR 66 | Temp 97.6°F | Resp 18 | Ht 69.0 in | Wt 214.4 lb

## 2020-05-28 DIAGNOSIS — R7303 Prediabetes: Secondary | ICD-10-CM

## 2020-05-28 DIAGNOSIS — R1013 Epigastric pain: Secondary | ICD-10-CM | POA: Diagnosis not present

## 2020-05-28 DIAGNOSIS — E785 Hyperlipidemia, unspecified: Secondary | ICD-10-CM | POA: Diagnosis not present

## 2020-05-28 MED ORDER — PANTOPRAZOLE SODIUM 40 MG PO TBEC: 40.0000 mg | DELAYED_RELEASE_TABLET | Freq: Every day | ORAL | 3 refills | Status: DC

## 2020-05-28 NOTE — Progress Notes (Signed)
Carlos Lambert 39 y.o.   Chief Complaint  Patient presents with  . GI Problem    Patient states since last monday he has had some discomfort in his stomach like Acid or Bloating feeling. Per patient he has took OTC medication for some relief and is feeling a bit better at this moment.    HISTORY OF PRESENT ILLNESS: This is a 39 y.o. male complaining of upper abdominal discomfort with occasional heartburn and metallic taste for the past week.  Took Pepto-Bismol and Pepcid with mild relief.  Able to eat and drink.  Denies nausea or vomiting.  Denies diarrhea.  Noticed stools were dark when he was taking Pepto-Bismol.  Back to normal now. Has history of dyslipidemia and high triglycerides, on Crestor 20 mg daily.  We will check a lipase today and rule out pancreatitis. No other complaints or medical concerns today.  HPI   Prior to Admission medications   Medication Sig Start Date End Date Taking? Authorizing Provider  Ascorbic Acid (VITAMIN C) 100 MG tablet Take 100 mg by mouth daily.   Yes [provider]  rosuvastatin (CRESTOR) 20 MG tablet Take 1 tablet (20 mg total) by mouth daily. 03/05/20  Yes Nickalous Stingley, Eilleen Kempf, MD  pantoprazole (PROTONIX) 40 MG tablet Take 1 tablet (40 mg total) by mouth daily. 05/28/20   Georgina Quint, MD    No Known Allergies  Patient Active Problem List   Diagnosis Date Noted  . Dyslipidemia 03/05/2020  . Prediabetes 03/05/2020  . Elevated triglycerides with high cholesterol 04/02/2018  . History of prediabetes 04/02/2018    No past medical history on file.  Past Surgical History:  Procedure Laterality Date  . KNEE SURGERY      Social History   Socioeconomic History  . Marital status: Divorced    Spouse name: Not on file  . Number of children: 1  . Years of education: Not on file  . Highest education level: Not on file  Occupational History  . Not on file  Tobacco Use  . Smoking status: Former Games developer  . Smokeless  tobacco: Never Used  Vaping Use  . Vaping Use: Never used  Substance and Sexual Activity  . Alcohol use: Yes  . Drug use: No  . Sexual activity: Not on file  Other Topics Concern  . Not on file  Social History Narrative  . Not on file   Social Determinants of Health   Financial Resource Strain:   . Difficulty of Paying Living Expenses:   Food Insecurity:   . Worried About Programme researcher, broadcasting/film/video in the Last Year:   . Barista in the Last Year:   Transportation Needs:   . Freight forwarder (Medical):   Marland Kitchen Lack of Transportation (Non-Medical):   Physical Activity:   . Days of Exercise per Week:   . Minutes of Exercise per Session:   Stress:   . Feeling of Stress :   Social Connections:   . Frequency of Communication with Friends and Family:   . Frequency of Social Gatherings with Friends and Family:   . Attends Religious Services:   . Active Member of Clubs or Organizations:   . Attends Banker Meetings:   Marland Kitchen Marital Status:   Intimate Partner Violence:   . Fear of Current or Ex-Partner:   . Emotionally Abused:   Marland Kitchen Physically Abused:   . Sexually Abused:     Family History  Problem Relation Age of Onset  .  Hypertension Father   . Hyperlipidemia Father   . Cancer Mother        LUNG     Review of Systems  Constitutional: Negative.  Negative for chills, fever and weight loss.  HENT: Negative.  Negative for congestion and sore throat.   Respiratory: Negative.  Negative for cough.   Cardiovascular: Negative.  Negative for chest pain and palpitations.  Gastrointestinal: Positive for abdominal pain and heartburn. Negative for blood in stool, constipation, diarrhea, melena and vomiting.  Genitourinary: Negative.  Negative for dysuria and hematuria.  Musculoskeletal: Negative for back pain, myalgias and neck pain.  Skin: Negative.  Negative for rash.  Neurological: Negative.  Negative for dizziness and headaches.  All other systems reviewed and are  negative.    Today's Vitals   05/28/20 1455  BP: 120/75  Pulse: 66  Resp: 18  Temp: 97.6 F (36.4 C)  TempSrc: Temporal  SpO2: 96%  Weight: 214 lb 6.4 oz (97.3 kg)  Height: 5\' 9"  (1.753 m)   Body mass index is 31.66 kg/m.   Physical Exam Vitals reviewed.  Constitutional:      Appearance: Normal appearance.  HENT:     Head: Normocephalic.     Mouth/Throat:     Mouth: Mucous membranes are moist.     Pharynx: Oropharynx is clear.  Eyes:     Extraocular Movements: Extraocular movements intact.     Conjunctiva/sclera: Conjunctivae normal.     Pupils: Pupils are equal, round, and reactive to light.  Cardiovascular:     Rate and Rhythm: Normal rate and regular rhythm.     Pulses: Normal pulses.     Heart sounds: Normal heart sounds.  Pulmonary:     Effort: Pulmonary effort is normal.     Breath sounds: Normal breath sounds.  Abdominal:     General: Bowel sounds are normal. There is no distension.     Palpations: Abdomen is soft.     Tenderness: There is no abdominal tenderness.  Musculoskeletal:        General: Normal range of motion.     Cervical back: Normal range of motion and neck supple.  Skin:    General: Skin is warm and dry.     Capillary Refill: Capillary refill takes less than 2 seconds.  Neurological:     General: No focal deficit present.     Mental Status: He is alert and oriented to person, place, and time.  Psychiatric:        Mood and Affect: Mood normal.        Behavior: Behavior normal.    A total of 30 minutes was spent with the patient, greater than 50% of which was in counseling/coordination of care regarding differential diagnosis of upper abdominal pain including pancreatitis and reflux disease, treatment and management, use of new medication Protonix 40 mg daily, diet and nutrition, review of most recent blood work results, review of most recent office visit note, prognosis and need for follow-up.   ASSESSMENT & PLAN: Carlos Lambert was seen today  for gi problem.  Diagnoses and all orders for this visit:  Dyspepsia -     Comprehensive metabolic panel -     Lipase -     pantoprazole (PROTONIX) 40 MG tablet; Take 1 tablet (40 mg total) by mouth daily. -     CBC with Differential/Platelet  Dyslipidemia  Prediabetes    Patient Instructions       If you have lab work done today you will be  contacted with your lab results within the next 2 weeks.  If you have not heard from us then please contact us. The fastest way to get your results is to register for My Chart.   IF you received an x-ray today, you will receive an invoice from [facility]. Please contact [facility] at [phone] with questions or concerns regarding your invoice.   IF you received labwork today, you will receive an invoice from [facility]. Please contact [facility] at [phone] with questions or concerns regarding your invoice.   Our billing staff will not be able to assist you with questions regarding bills from these companies.  You will be contacted with the lab results as soon as they are available. The fastest way to get your results is to activate your My Chart account. Instructions are located on the last page of this paperwork. If you have not heard from us regarding the results in 2 weeks, please contact this office.      Heartburn Heartburn is a type of pain or discomfort that can happen in the throat or chest. It is often described as a burning pain. It may also cause a bad, acid-like taste in the mouth. Heartburn may feel worse when you lie down or bend over. It may be worse at night. It may be caused by stomach contents that move back up (reflux) into the tube that connects the mouth with the stomach (esophagus). Follow these instructions at home: Eating and drinking   Avoid certain foods and drinks as told by your doctor. This may include: ? Coffee and tea (with or without caffeine). ? Drinks that have  alcohol. ? Energy drinks and sports drinks. ? Carbonated drinks or sodas. ? Chocolate and cocoa. ? Peppermint and mint flavorings. ? Garlic and onions. ? Horseradish. ? Spicy and acidic foods, such as:  Peppers.  Chili powder and curry powder.  Vinegar.  Hot sauces and BBQ sauce. ? Citrus fruit juices and citrus fruits, such as:  Oranges.  Lemons.  Limes. ? Tomato-based foods, such as:  Red sauce and pizza with red sauce.  Chili.  Salsa. ? Fried and fatty foods, such as:  Donuts.  [place] fries and potato chips.  High-fat dressings. ? High-fat meats, such as:  Hot dogs and sausage.  Rib eye steak.  Ham and bacon. ? High-fat dairy items, such as:  Whole milk.  Butter.  Cream cheese.  Eat small meals often. Avoid eating large meals.  Avoid drinking large amounts of liquid with your meals.  Avoid eating meals during the 2-3 hours before bedtime.  Avoid lying down right after you eat.  Do not exercise right after you eat. Lifestyle      If you are overweight, lose an amount of weight that is healthy for you. Ask your doctor about a safe weight loss goal.  Do not use any products that contain nicotine or tobacco, including cigarettes, e-cigarettes, and chewing tobacco. These can make your symptoms worse. If you need help quitting, ask your doctor.  Wear loose clothes. Do not wear anything tight around your waist.  Raise (elevate) the head of your bed about 6 inches (15 cm) when you sleep.  Try to lower your stress. If you need help doing this, ask your doctor. General instructions  Pay attention to any changes in your symptoms.  Take over-the-counter and prescription medicines only as told by your doctor. ? Do not take aspirin, ibuprofen, or other NSAIDs unless your doctor says it is  okay. ? Stop medicines only as told by your doctor.  Keep all follow-up visits as told by your doctor. This is important. Contact a doctor if:  You have new  symptoms.  You lose weight and you do not know why it is happening.  You have trouble swallowing, or it hurts to swallow.  You have wheezing or a cough that keeps happening.  Your symptoms do not get better with treatment.  You have heartburn often for more than 2 weeks. Get help right away if:  You have pain in your arms, neck, jaw, teeth, or back.  You feel sweaty, dizzy, or light-headed.  You have chest pain or shortness of breath.  You throw up (vomit) and your throw up looks like blood or coffee grounds.  Your poop (stool) is bloody or black. These symptoms may represent a serious problem that is an emergency. Do not wait to see if the symptoms will go away. Get medical help right away. Call your local emergency services (911 in the [place] Do not drive yourself to the hospital. Summary  Heartburn is a type of pain that can happen in the throat or chest. It can feel like a burning pain. It may also cause a bad, acid-like taste in the mouth.  You may need to avoid certain foods and drinks to help your symptoms. Ask your doctor what foods and drinks you should avoid.  Take over-the-counter and prescription medicines only as told by your doctor. Do not take aspirin, ibuprofen, or other NSAIDs unless your doctor told you to do so.  Contact your doctor if your symptoms do not get better or they get worse. This information is not intended to replace advice given to you by your health care provider. Make sure you discuss any questions you have with your health care provider. Document Revised: 02/28/2018 Document Reviewed: 02/28/2018 Elsevier Patient Education  2020 Elsevier Inc.      Edwina Barth, MD [facility]

## 2020-05-28 NOTE — Patient Instructions (Addendum)
If you have lab work done today you will be contacted with your lab results within the next 2 weeks.  If you have not heard from [place] then please contact us. The fastest way to get your results is to register for My Chart.   IF you received an x-ray today, you will receive an invoice from [facility]. Please contact [facility] at [phone] with questions or concerns regarding your invoice.   IF you received labwork today, you will receive an invoice from [facility]. Please contact [facility] at [phone] with questions or concerns regarding your invoice.   Our billing staff will not be able to assist you with questions regarding bills from these companies.  You will be contacted with the lab results as soon as they are available. The fastest way to get your results is to activate your My Chart account. Instructions are located on the last page of this paperwork. If you have not heard from [place] regarding the results in 2 weeks, please contact this office.      Heartburn Heartburn is a type of pain or discomfort that can happen in the throat or chest. It is often described as a burning pain. It may also cause a bad, acid-like taste in the mouth. Heartburn may feel worse when you lie down or bend over. It may be worse at night. It may be caused by stomach contents that move back up (reflux) into the tube that connects the mouth with the stomach (esophagus). Follow these instructions at home: Eating and drinking   Avoid certain foods and drinks as told by your doctor. This may include: ? Coffee and tea (with or without caffeine). ? Drinks that have alcohol. ? Energy drinks and sports drinks. ? Carbonated drinks or sodas. ? Chocolate and cocoa. ? Peppermint and mint flavorings. ? Garlic and onions. ? Horseradish. ? Spicy and acidic foods, such as:  Peppers.  Chili powder and curry powder.  Vinegar.  Hot sauces and BBQ sauce. ? Citrus fruit juices and citrus  fruits, such as:  Oranges.  Lemons.  Limes. ? Tomato-based foods, such as:  Red sauce and pizza with red sauce.  Chili.  Salsa. ? Fried and fatty foods, such as:  Donuts.  [place] fries and potato chips.  High-fat dressings. ? High-fat meats, such as:  Hot dogs and sausage.  Rib eye steak.  Ham and bacon. ? High-fat dairy items, such as:  Whole milk.  Butter.  Cream cheese.  Eat small meals often. Avoid eating large meals.  Avoid drinking large amounts of liquid with your meals.  Avoid eating meals during the 2-3 hours before bedtime.  Avoid lying down right after you eat.  Do not exercise right after you eat. Lifestyle      If you are overweight, lose an amount of weight that is healthy for you. Ask your doctor about a safe weight loss goal.  Do not use any products that contain nicotine or tobacco, including cigarettes, e-cigarettes, and chewing tobacco. These can make your symptoms worse. If you need help quitting, ask your doctor.  Wear loose clothes. Do not wear anything tight around your waist.  Raise (elevate) the head of your bed about 6 inches (15 cm) when you sleep.  Try to lower your stress. If you need help doing this, ask your doctor. General instructions  Pay attention to any changes in your symptoms.  Take over-the-counter and prescription medicines only as told by your doctor. ? Do  not take aspirin, ibuprofen, or other NSAIDs unless your doctor says it is okay. ? Stop medicines only as told by your doctor.  Keep all follow-up visits as told by your doctor. This is important. Contact a doctor if:  You have new symptoms.  You lose weight and you do not know why it is happening.  You have trouble swallowing, or it hurts to swallow.  You have wheezing or a cough that keeps happening.  Your symptoms do not get better with treatment.  You have heartburn often for more than 2 weeks. Get help right away if:  You have pain in  your arms, neck, jaw, teeth, or back.  You feel sweaty, dizzy, or light-headed.  You have chest pain or shortness of breath.  You throw up (vomit) and your throw up looks like blood or coffee grounds.  Your poop (stool) is bloody or black. These symptoms may represent a serious problem that is an emergency. Do not wait to see if the symptoms will go away. Get medical help right away. Call your local emergency services (911 in the [place] Do not drive yourself to the hospital. Summary  Heartburn is a type of pain that can happen in the throat or chest. It can feel like a burning pain. It may also cause a bad, acid-like taste in the mouth.  You may need to avoid certain foods and drinks to help your symptoms. Ask your doctor what foods and drinks you should avoid.  Take over-the-counter and prescription medicines only as told by your doctor. Do not take aspirin, ibuprofen, or other NSAIDs unless your doctor told you to do so.  Contact your doctor if your symptoms do not get better or they get worse. This information is not intended to replace advice given to you by your health care provider. Make sure you discuss any questions you have with your health care provider. Document Revised: 02/28/2018 Document Reviewed: 02/28/2018 Elsevier Patient Education  2020 [employer].

## 2020-05-29 LAB — COMPREHENSIVE METABOLIC PANEL
ALT: 31 IU/L (ref 0–44)
AST: 18 IU/L (ref 0–40)
Albumin/Globulin Ratio: 1.9 (ref 1.2–2.2)
Albumin: 4.5 g/dL (ref 4.0–5.0)
Alkaline Phosphatase: 75 IU/L (ref 48–121)
BUN/Creatinine Ratio: 12 (ref 9–20)
BUN: 12 mg/dL (ref 6–20)
Bilirubin Total: 0.4 mg/dL (ref 0.0–1.2)
CO2: 24 mmol/L (ref 20–29)
Calcium: 9.2 mg/dL (ref 8.7–10.2)
Chloride: 104 mmol/L (ref 96–106)
Creatinine, Ser: 1.03 mg/dL (ref 0.76–1.27)
GFR calc Af Amer: 107 mL/min/{1.73_m2} (ref >59)
GFR calc non Af Amer: 92 mL/min/{1.73_m2} (ref >59)
Globulin, Total: 2.4 g/dL (ref 1.5–4.5)
Glucose: 81 mg/dL (ref 65–99)
Potassium: 4.2 mmol/L (ref 3.5–5.2)
Sodium: 142 mmol/L (ref 134–144)
Total Protein: 6.9 g/dL (ref 6.0–8.5)

## 2020-05-29 LAB — CBC WITH DIFFERENTIAL/PLATELET
Basophils Absolute: 0 10*3/uL (ref 0.0–0.2)
Basos: 1 %
EOS (ABSOLUTE): 0.1 10*3/uL (ref 0.0–0.4)
Eos: 1 %
Hematocrit: 40.6 % (ref 37.5–51.0)
Hemoglobin: 14.2 g/dL (ref 13.0–17.7)
Immature Grans (Abs): 0 10*3/uL (ref 0.0–0.1)
Immature Granulocytes: 0 %
Lymphocytes Absolute: 2.4 10*3/uL (ref 0.7–3.1)
Lymphs: 35 %
MCH: 29.9 pg (ref 26.6–33.0)
MCHC: 35 g/dL (ref 31.5–35.7)
MCV: 86 fL (ref 79–97)
Monocytes Absolute: 0.6 10*3/uL (ref 0.1–0.9)
Monocytes: 9 %
Neutrophils Absolute: 3.9 10*3/uL (ref 1.4–7.0)
Neutrophils: 54 %
Platelets: 236 10*3/uL (ref 150–450)
RBC: 4.75 x10E6/uL (ref 4.14–5.80)
RDW: 13 % (ref 11.6–15.4)
WBC: 7.1 10*3/uL (ref 3.4–10.8)

## 2020-05-29 LAB — LIPASE: Lipase: 36 U/L (ref 13–78)

## 2020-07-24 DIAGNOSIS — Z20822 Contact with and (suspected) exposure to covid-19: Secondary | ICD-10-CM | POA: Diagnosis not present

## 2020-09-09 ENCOUNTER — Encounter: Payer: Self-pay | Admitting: Emergency Medicine

## 2020-09-09 ENCOUNTER — Other Ambulatory Visit: Payer: Self-pay

## 2020-09-09 ENCOUNTER — Ambulatory Visit (INDEPENDENT_AMBULATORY_CARE_PROVIDER_SITE_OTHER): Payer: BC Managed Care – PPO | Admitting: Emergency Medicine

## 2020-09-09 VITALS — BP 120/76 | HR 76 | Temp 98.0°F | Ht 69.0 in | Wt 218.2 lb

## 2020-09-09 DIAGNOSIS — Z20822 Contact with and (suspected) exposure to covid-19: Secondary | ICD-10-CM

## 2020-09-09 DIAGNOSIS — E785 Hyperlipidemia, unspecified: Secondary | ICD-10-CM

## 2020-09-09 DIAGNOSIS — R7303 Prediabetes: Secondary | ICD-10-CM | POA: Diagnosis not present

## 2020-09-09 NOTE — Progress Notes (Signed)
Carlos Lambert 39 y.o.   Chief Complaint  Patient presents with  . Medical Management of Chronic Issues    6 m f/u   . Hyperlipidemia    HISTORY OF PRESENT ILLNESS: This is a 39 y.o. male with history of dyslipidemia on Crestor 20 mg daily here for follow-up. Has no complaints or medical concerns.  HPI   Prior to Admission medications   Medication Sig Start Date End Date Taking? Authorizing Provider  pantoprazole (PROTONIX) 40 MG tablet Take 1 tablet (40 mg total) by mouth daily. 05/28/20  Yes Alyssia Heese, Ines Bloomer, MD  rosuvastatin (CRESTOR) 20 MG tablet Take 1 tablet (20 mg total) by mouth daily. 03/05/20  Yes Myking Sar, Ines Bloomer, MD  Ascorbic Acid (VITAMIN C) 100 MG tablet Take 100 mg by mouth daily. Patient not taking: Reported on 09/09/2020    [provider]    No Known Allergies  Patient Active Problem List   Diagnosis Date Noted  . Dyslipidemia 03/05/2020  . Prediabetes 03/05/2020  . Elevated triglycerides with high cholesterol 04/02/2018  . History of prediabetes 04/02/2018    No past medical history on file.  Past Surgical History:  Procedure Laterality Date  . KNEE SURGERY      Social History   Socioeconomic History  . Marital status: Divorced    Spouse name: Not on file  . Number of children: 1  . Years of education: Not on file  . Highest education level: Not on file  Occupational History  . Not on file  Tobacco Use  . Smoking status: Former Research scientist (life sciences)  . Smokeless tobacco: Never Used  Vaping Use  . Vaping Use: Never used  Substance and Sexual Activity  . Alcohol use: Yes  . Drug use: No  . Sexual activity: Not on file  Other Topics Concern  . Not on file  Social History Narrative  . Not on file   Social Determinants of Health   Financial Resource Strain:   . Difficulty of Paying Living Expenses: Not on file  Food Insecurity:   . Worried About Charity fundraiser in the Last Year: Not on file  . Ran Out of Food in the Last  Year: Not on file  Transportation Needs:   . Lack of Transportation (Medical): Not on file  . Lack of Transportation (Non-Medical): Not on file  Physical Activity:   . Days of Exercise per Week: Not on file  . Minutes of Exercise per Session: Not on file  Stress:   . Feeling of Stress : Not on file  Social Connections:   . Frequency of Communication with Friends and Family: Not on file  . Frequency of Social Gatherings with Friends and Family: Not on file  . Attends Religious Services: Not on file  . Active Member of Clubs or Organizations: Not on file  . Attends Archivist Meetings: Not on file  . Marital Status: Not on file  Intimate Partner Violence:   . Fear of Current or Ex-Partner: Not on file  . Emotionally Abused: Not on file  . Physically Abused: Not on file  . Sexually Abused: Not on file    Family History  Problem Relation Age of Onset  . Hypertension Father   . Hyperlipidemia Father   . Cancer Mother        LUNG     Review of Systems  Constitutional: Negative.  Negative for chills and fever.  HENT: Negative.  Negative for congestion and sore throat.  Respiratory: Negative.  Negative for cough and shortness of breath.   Cardiovascular: Negative.  Negative for chest pain and palpitations.  Gastrointestinal: Negative.  Negative for abdominal pain, nausea and vomiting.  Genitourinary: Negative.  Negative for dysuria and hematuria.  Skin: Negative.  Negative for rash.  Neurological: Negative.  Negative for dizziness and headaches.  All other systems reviewed and are negative.   Today's Vitals   09/09/20 1517  BP: 120/76  Pulse: 76  Temp: 98 F (36.7 C)  TempSrc: Temporal  SpO2: 97%  Weight: 218 lb 3.2 oz (99 kg)  Height: _0  (1.753 m)   Body mass index is 32.22 kg/m. Wt Readings from Last 3 Encounters:  09/09/20 218 lb 3.2 oz (99 kg)  05/28/20 214 lb 6.4 oz (97.3 kg)  03/05/20 217 lb 9.6 oz (98.7 kg)    Physical Exam Vitals reviewed.   Constitutional:      Appearance: Normal appearance.  HENT:     Head: Normocephalic.  Eyes:     Extraocular Movements: Extraocular movements intact.     Pupils: Pupils are equal, round, and reactive to light.  Cardiovascular:     Rate and Rhythm: Normal rate and regular rhythm.     Pulses: Normal pulses.     Heart sounds: Normal heart sounds.  Pulmonary:     Effort: Pulmonary effort is normal.     Breath sounds: Normal breath sounds.  Musculoskeletal:        General: Normal range of motion.     Cervical back: Normal range of motion.  Skin:    General: Skin is warm and dry.     Capillary Refill: Capillary refill takes less than 2 seconds.  Neurological:     General: No focal deficit present.     Mental Status: He is alert and oriented to person, place, and time.  Psychiatric:        Mood and Affect: Mood normal.        Behavior: Behavior normal.      ASSESSMENT & PLAN: Rhet was seen today for medical management of chronic issues and hyperlipidemia.  Diagnoses and all orders for this visit:  Dyslipidemia -     CMP14+EGFR -     Lipid panel -     Hemoglobin A1c  Prediabetes  Exposure to COVID-19 virus -     SAR CoV2 Serology (COVID 19)AB(IGG)IA    Patient Instructions       If you have lab work done today you will be contacted with your lab results within the next 2 weeks.  If you have not heard from [place] then please contact us. The fastest way to get your results is to register for My Chart.   IF you received an x-ray today, you will receive an invoice from [facility]. Please contact [facility] at [phone] with questions or concerns regarding your invoice.   IF you received labwork today, you will receive an invoice from [facility]. Please contact [facility] at [phone] with questions or concerns regarding your invoice.   Our billing staff will not be able to assist you with questions regarding bills from these companies.  You will  be contacted with the lab results as soon as they are available. The fastest way to get your results is to activate your My Chart account. Instructions are located on the last page of this paperwork. If you have not heard from [place] regarding the results in 2 weeks, please contact this office.  Dyslipidemia Dyslipidemia is an imbalance of waxy, fat-like substances (lipids) in the blood. The body needs lipids in small amounts. Dyslipidemia often involves a high level of cholesterol or triglycerides, which are types of lipids. Common forms of dyslipidemia include:  High levels of LDL cholesterol. LDL is the type of cholesterol that causes fatty deposits (plaques) to build up in the blood vessels that carry blood away from your heart (arteries).  Low levels of HDL cholesterol. HDL cholesterol is the type of cholesterol that protects against heart disease. High levels of HDL remove the LDL buildup from arteries.  High levels of triglycerides. Triglycerides are a fatty substance in the blood that is linked to a buildup of plaques in the arteries. What are the causes? Primary dyslipidemia is caused by changes (mutations) in genes that are passed down through families (inherited). These mutations cause several types of dyslipidemia. Secondary dyslipidemia is caused by lifestyle choices and diseases that lead to dyslipidemia, such as:  Eating a diet that is high in animal fat.  Not getting enough exercise.  Having diabetes, kidney disease, liver disease, or thyroid disease.  Drinking large amounts of alcohol.  Using certain medicines. What increases the risk? You are more likely to develop this condition if you are an older man or if you are a woman who has gone through menopause. Other risk factors include:  Having a family history of dyslipidemia.  Taking certain medicines, including birth control pills, steroids, some diuretics, and beta-blockers.  Smoking cigarettes.  Eating a  high-fat diet.  Having certain medical conditions such as diabetes, polycystic ovary syndrome (PCOS), kidney disease, liver disease, or hypothyroidism.  Not exercising regularly.  Being overweight or obese with too much belly fat. What are the signs or symptoms? In most cases, dyslipidemia does not usually cause any symptoms. In severe cases, very high lipid levels can cause:  Fatty bumps under the skin (xanthomas).  White or gray ring around the black center (pupil) of the eye. Very high triglyceride levels can cause inflammation of the pancreas (pancreatitis). How is this diagnosed? Your health care provider may diagnose dyslipidemia based on a routine blood test (fasting blood test). Because most people do not have symptoms of the condition, this blood testing (lipid profile) is done on adults age 4 and older and is repeated every 5 years. This test checks:  Total cholesterol. This measures the total amount of cholesterol in your blood, including LDL cholesterol, HDL cholesterol, and triglycerides. A healthy number is below 200.  LDL cholesterol. The target number for LDL cholesterol is different for each person, depending on individual risk factors. Ask your health care provider what your LDL cholesterol should be.  HDL cholesterol. An HDL level of 60 or higher is best because it helps to protect against heart disease. A number below 51 for men or below 43 for women increases the risk for heart disease.  Triglycerides. A healthy triglyceride number is below 150. If your lipid profile is abnormal, your health care provider may do other blood tests. How is this treated? Treatment depends on the type of dyslipidemia that you have and your other risk factors for heart disease and stroke. Your health care provider will have a target range for your lipid levels based on this information. For many people, this condition may be treated by lifestyle changes, such as diet and exercise. Your  health care provider may recommend that you:  Get regular exercise.  Make changes to your diet.  Quit smoking if  you smoke. If diet changes and exercise do not help you reach your goals, your health care provider may also prescribe medicine to lower lipids. The most commonly prescribed type of medicine lowers your LDL cholesterol (statin drug). If you have a high triglyceride level, your provider may prescribe another type of drug (fibrate) or an omega-3 fish oil supplement, or both. Follow these instructions at home:  Eating and drinking  Follow instructions from your health care provider or dietitian about eating or drinking restrictions.  Eat a healthy diet as told by your health care provider. This can help you reach and maintain a healthy weight, lower your LDL cholesterol, and raise your HDL cholesterol. This may include: ? Limiting your calories, if you are overweight. ? Eating more fruits, vegetables, whole grains, fish, and lean meats. ? Limiting saturated fat, trans fat, and cholesterol.  If you drink alcohol: ? Limit how much you use. ? Be aware of how much alcohol is in your drink. In the [place] one drink equals one 12 oz bottle of beer (355 mL), one 5 oz glass of wine (148 mL), or one 1 oz glass of hard liquor (44 mL).  Do not drink alcohol if: ? Your health care provider tells you not to drink. ? You are pregnant, may be pregnant, or are planning to become pregnant. Activity  Get regular exercise. Start an exercise and strength training program as told by your health care provider. Ask your health care provider what activities are safe for you. Your health care provider may recommend: ? 30 minutes of aerobic activity 4-6 days a week. Brisk walking is an example of aerobic activity. ? Strength training 2 days a week. General instructions  Do not use any products that contain nicotine or tobacco, such as cigarettes, e-cigarettes, and chewing tobacco. If you need help  quitting, ask your health care provider.  Take over-the-counter and prescription medicines only as told by your health care provider. This includes supplements.  Keep all follow-up visits as told by your health care provider. Contact a health care provider if:  You are: ? Having trouble sticking to your exercise or diet plan. ? Struggling to quit smoking or control your use of alcohol. Summary  Dyslipidemia often involves a high level of cholesterol or triglycerides, which are types of lipids.  Treatment depends on the type of dyslipidemia that you have and your other risk factors for heart disease and stroke.  For many people, treatment starts with lifestyle changes, such as diet and exercise.  Your health care provider may prescribe medicine to lower lipids. This information is not intended to replace advice given to you by your health care provider. Make sure you discuss any questions you have with your health care provider. Document Revised: 05/23/2018 Document Reviewed: 04/29/2018 Elsevier Patient Education  2020 Elsevier Inc.      Agustina Caroli, MD [facility]

## 2020-09-09 NOTE — Patient Instructions (Addendum)
   If you have lab work done today you will be contacted with your lab results within the next 2 weeks.  If you have not heard from us then please contact us. The fastest way to get your results is to register for My Chart.   IF you received an x-ray today, you will receive an invoice from [facility]. Please contact [facility] at [phone] with questions or concerns regarding your invoice.   IF you received labwork today, you will receive an invoice from [facility]. Please contact [facility] at [phone] with questions or concerns regarding your invoice.   Our billing staff will not be able to assist you with questions regarding bills from these companies.  You will be contacted with the lab results as soon as they are available. The fastest way to get your results is to activate your My Chart account. Instructions are located on the last page of this paperwork. If you have not heard from us regarding the results in 2 weeks, please contact this office.     Dyslipidemia Dyslipidemia is an imbalance of waxy, fat-like substances (lipids) in the blood. The body needs lipids in small amounts. Dyslipidemia often involves a high level of cholesterol or triglycerides, which are types of lipids. Common forms of dyslipidemia include:  High levels of LDL cholesterol. LDL is the type of cholesterol that causes fatty deposits (plaques) to build up in the blood vessels that carry blood away from your heart (arteries).  Low levels of HDL cholesterol. HDL cholesterol is the type of cholesterol that protects against heart disease. High levels of HDL remove the LDL buildup from arteries.  High levels of triglycerides. Triglycerides are a fatty substance in the blood that is linked to a buildup of plaques in the arteries. What are the causes? Primary dyslipidemia is caused by changes (mutations) in genes that are passed down through families (inherited). These mutations cause several  types of dyslipidemia. Secondary dyslipidemia is caused by lifestyle choices and diseases that lead to dyslipidemia, such as:  Eating a diet that is high in animal fat.  Not getting enough exercise.  Having diabetes, kidney disease, liver disease, or thyroid disease.  Drinking large amounts of alcohol.  Using certain medicines. What increases the risk? You are more likely to develop this condition if you are an older man or if you are a woman who has gone through menopause. Other risk factors include:  Having a family history of dyslipidemia.  Taking certain medicines, including birth control pills, steroids, some diuretics, and beta-blockers.  Smoking cigarettes.  Eating a high-fat diet.  Having certain medical conditions such as diabetes, polycystic ovary syndrome (PCOS), kidney disease, liver disease, or hypothyroidism.  Not exercising regularly.  Being overweight or obese with too much belly fat. What are the signs or symptoms? In most cases, dyslipidemia does not usually cause any symptoms. In severe cases, very high lipid levels can cause:  Fatty bumps under the skin (xanthomas).  White or gray ring around the black center (pupil) of the eye. Very high triglyceride levels can cause inflammation of the pancreas (pancreatitis). How is this diagnosed? Your health care provider may diagnose dyslipidemia based on a routine blood test (fasting blood test). Because most people do not have symptoms of the condition, this blood testing (lipid profile) is done on adults age 20 and older and is repeated every 5 years. This test checks:  Total cholesterol. This measures the total amount of cholesterol in your blood, including LDL   cholesterol, HDL cholesterol, and triglycerides. A healthy number is below 200.  LDL cholesterol. The target number for LDL cholesterol is different for each person, depending on individual risk factors. Ask your health care provider what your LDL  cholesterol should be.  HDL cholesterol. An HDL level of 60 or higher is best because it helps to protect against heart disease. A number below 40 for men or below 50 for women increases the risk for heart disease.  Triglycerides. A healthy triglyceride number is below 150. If your lipid profile is abnormal, your health care provider may do other blood tests. How is this treated? Treatment depends on the type of dyslipidemia that you have and your other risk factors for heart disease and stroke. Your health care provider will have a target range for your lipid levels based on this information. For many people, this condition may be treated by lifestyle changes, such as diet and exercise. Your health care provider may recommend that you:  Get regular exercise.  Make changes to your diet.  Quit smoking if you smoke. If diet changes and exercise do not help you reach your goals, your health care provider may also prescribe medicine to lower lipids. The most commonly prescribed type of medicine lowers your LDL cholesterol (statin drug). If you have a high triglyceride level, your provider may prescribe another type of drug (fibrate) or an omega-3 fish oil supplement, or both. Follow these instructions at home:  Eating and drinking  Follow instructions from your health care provider or dietitian about eating or drinking restrictions.  Eat a healthy diet as told by your health care provider. This can help you reach and maintain a healthy weight, lower your LDL cholesterol, and raise your HDL cholesterol. This may include: ? Limiting your calories, if you are overweight. ? Eating more fruits, vegetables, whole grains, fish, and lean meats. ? Limiting saturated fat, trans fat, and cholesterol.  If you drink alcohol: ? Limit how much you use. ? Be aware of how much alcohol is in your drink. In the [place] one drink equals one 12 oz bottle of beer (355 mL), one 5 oz glass of wine (148 mL), or one 1  oz glass of hard liquor (44 mL).  Do not drink alcohol if: ? Your health care provider tells you not to drink. ? You are pregnant, may be pregnant, or are planning to become pregnant. Activity  Get regular exercise. Start an exercise and strength training program as told by your health care provider. Ask your health care provider what activities are safe for you. Your health care provider may recommend: ? 30 minutes of aerobic activity 4-6 days a week. Brisk walking is an example of aerobic activity. ? Strength training 2 days a week. General instructions  Do not use any products that contain nicotine or tobacco, such as cigarettes, e-cigarettes, and chewing tobacco. If you need help quitting, ask your health care provider.  Take over-the-counter and prescription medicines only as told by your health care provider. This includes supplements.  Keep all follow-up visits as told by your health care provider. Contact a health care provider if:  You are: ? Having trouble sticking to your exercise or diet plan. ? Struggling to quit smoking or control your use of alcohol. Summary  Dyslipidemia often involves a high level of cholesterol or triglycerides, which are types of lipids.  Treatment depends on the type of dyslipidemia that you have and your other risk factors for heart disease and   stroke.  For many people, treatment starts with lifestyle changes, such as diet and exercise.  Your health care provider may prescribe medicine to lower lipids. This information is not intended to replace advice given to you by your health care provider. Make sure you discuss any questions you have with your health care provider. Document Revised: 05/23/2018 Document Reviewed: 04/29/2018 Elsevier Patient Education  2020 [employer]vier Inc.  

## 2020-09-10 ENCOUNTER — Telehealth: Payer: Self-pay | Admitting: Emergency Medicine

## 2020-09-10 LAB — CMP14+EGFR
ALT: 39 IU/L (ref 0–44)
AST: 21 IU/L (ref 0–40)
Albumin/Globulin Ratio: 1.9 (ref 1.2–2.2)
Albumin: 4.5 g/dL (ref 4.0–5.0)
Alkaline Phosphatase: 71 IU/L (ref 44–121)
BUN/Creatinine Ratio: 18 (ref 9–20)
BUN: 18 mg/dL (ref 6–20)
Bilirubin Total: 0.3 mg/dL (ref 0.0–1.2)
CO2: 25 mmol/L (ref 20–29)
Calcium: 9.2 mg/dL (ref 8.7–10.2)
Chloride: 105 mmol/L (ref 96–106)
Creatinine, Ser: 1 mg/dL (ref 0.76–1.27)
GFR calc Af Amer: 111 mL/min/{1.73_m2} (ref >59)
GFR calc non Af Amer: 96 mL/min/{1.73_m2} (ref >59)
Globulin, Total: 2.4 g/dL (ref 1.5–4.5)
Glucose: 85 mg/dL (ref 65–99)
Potassium: 4.3 mmol/L (ref 3.5–5.2)
Sodium: 142 mmol/L (ref 134–144)
Total Protein: 6.9 g/dL (ref 6.0–8.5)

## 2020-09-10 LAB — SAR COV2 SEROLOGY (COVID19)AB(IGG),IA
SARS-CoV-2 Semi-Quant IgG Ab: 20.7 AU/mL (ref <13.0)
SARS-CoV-2 Spike Ab Interp: POSITIVE

## 2020-09-10 LAB — LIPID PANEL
Chol/HDL Ratio: 3.9 ratio (ref 0.0–5.0)
Cholesterol, Total: 147 mg/dL (ref 100–199)
HDL: 38 mg/dL — ABNORMAL LOW (ref >39)
LDL Chol Calc (NIH): 78 mg/dL (ref 0–99)
Triglycerides: 180 mg/dL — ABNORMAL HIGH (ref 0–149)
VLDL Cholesterol Cal: 31 mg/dL (ref 5–40)

## 2020-09-10 LAB — HEMOGLOBIN A1C
Est. average glucose Bld gHb Est-mCnc: 123 mg/dL
Hgb A1c MFr Bld: 5.9 % — ABNORMAL HIGH (ref 4.8–5.6)

## 2020-09-10 NOTE — Telephone Encounter (Signed)
Blood results discussed with patient. 

## 2020-12-24 ENCOUNTER — Ambulatory Visit (INDEPENDENT_AMBULATORY_CARE_PROVIDER_SITE_OTHER): Payer: BC Managed Care – PPO | Admitting: Emergency Medicine

## 2020-12-24 ENCOUNTER — Other Ambulatory Visit: Payer: Self-pay

## 2020-12-24 ENCOUNTER — Encounter: Payer: Self-pay | Admitting: Emergency Medicine

## 2020-12-24 VITALS — BP 128/81 | HR 82 | Temp 98.6°F | Resp 16 | Ht 69.0 in | Wt 214.8 lb

## 2020-12-24 DIAGNOSIS — R599 Enlarged lymph nodes, unspecified: Secondary | ICD-10-CM

## 2020-12-24 DIAGNOSIS — S70922A Unspecified superficial injury of left thigh, initial encounter: Secondary | ICD-10-CM | POA: Diagnosis not present

## 2020-12-24 DIAGNOSIS — T148XXA Other injury of unspecified body region, initial encounter: Secondary | ICD-10-CM

## 2020-12-24 MED ORDER — MUPIROCIN CALCIUM 2 % EX CREA: 1.0000 "application " | TOPICAL_CREAM | Freq: Two times a day (BID) | CUTANEOUS | 0 refills | Status: DC

## 2020-12-24 NOTE — Progress Notes (Signed)
Carlos Lambert 40 y.o.   Chief Complaint  Patient presents with  . gland    Per patient swollen left inner thigh for 1 week.    HISTORY OF PRESENT ILLNESS: This is a 40 y.o. male complaining of swollen lymph node on left inner thigh that started 1 week ago progressively getting better. Also noticed skin abrasion to left groin area.  Denies urinary or abdominal symptoms. No urethral discharge or dysuria. No other associated symptoms.  HPI   Prior to Admission medications   Medication Sig Start Date End Date Taking? Authorizing Provider  pantoprazole (PROTONIX) 40 MG tablet Take 1 tablet (40 mg total) by mouth daily. 05/28/20  Yes Letitia Sabala, Eilleen Kempf, MD  rosuvastatin (CRESTOR) 20 MG tablet Take 1 tablet (20 mg total) by mouth daily. 03/05/20  Yes Georgina Quint, MD    Not on File  Patient Active Problem List   Diagnosis Date Noted  . Dyslipidemia 03/05/2020  . Prediabetes 03/05/2020  . Elevated triglycerides with high cholesterol 04/02/2018  . History of prediabetes 04/02/2018    No past medical history on file.  Past Surgical History:  Procedure Laterality Date  . KNEE SURGERY      Social History   Socioeconomic History  . Marital status: Divorced    Spouse name: Not on file  . Number of children: 1  . Years of education: Not on file  . Highest education level: Not on file  Occupational History  . Not on file  Tobacco Use  . Smoking status: Former Games developer  . Smokeless tobacco: Never Used  Vaping Use  . Vaping Use: Never used  Substance and Sexual Activity  . Alcohol use: Yes  . Drug use: No  . Sexual activity: Not on file  Other Topics Concern  . Not on file  Social History Narrative  . Not on file   Social Determinants of Health   Financial Resource Strain: Not on file  Food Insecurity: Not on file  Transportation Needs: Not on file  Physical Activity: Not on file  Stress: Not on file  Social Connections: Not on file  Intimate Partner  Violence: Not on file    Family History  Problem Relation Age of Onset  . Hypertension Father   . Hyperlipidemia Father   . Cancer Mother        LUNG     Review of Systems  Constitutional: Negative.  Negative for chills and fever.  HENT: Negative.  Negative for congestion and sore throat.   Respiratory: Negative.  Negative for cough and shortness of breath.   Cardiovascular: Negative.  Negative for chest pain and palpitations.  Gastrointestinal: Negative.  Negative for abdominal pain, nausea and vomiting.  Genitourinary: Negative.  Negative for dysuria, frequency and hematuria.  Skin: Positive for rash (Left groin).  Neurological: Negative.  Negative for dizziness and headaches.  All other systems reviewed and are negative.  Today's Vitals   12/24/20 1348  BP: 128/81  Pulse: 82  Resp: 16  Temp: 98.6 F (37 C)  TempSrc: Temporal  SpO2: 97%  Weight: 214 lb 12.8 oz (97.4 kg)  Height: 5\' 9"  (1.753 m)   Body mass index is 31.72 kg/m.   Physical Exam Vitals reviewed.  Constitutional:      Appearance: Normal appearance.  HENT:     Head: Normocephalic.  Eyes:     Extraocular Movements: Extraocular movements intact.     Pupils: Pupils are equal, round, and reactive to light.  Cardiovascular:  Rate and Rhythm: Normal rate.  Pulmonary:     Effort: Pulmonary effort is normal.  Abdominal:     Palpations: Abdomen is soft.     Tenderness: There is no abdominal tenderness.  Musculoskeletal:     Cervical back: Normal range of motion.  Lymphadenopathy:     Lower Body: Left inguinal adenopathy (and femoral) present.  Skin:    General: Skin is warm and dry.  Neurological:     General: No focal deficit present.     Mental Status: He is alert and oriented to person, place, and time.  Psychiatric:        Mood and Affect: Mood normal.        Behavior: Behavior normal.      ASSESSMENT & PLAN: Goldie was seen today for gland.  Diagnoses and all orders for this  visit:  Skin abrasion -     mupirocin cream (BACTROBAN) 2 %; Apply 1 application topically 2 (two) times daily.  Reactive lymphadenopathy    Patient Instructions       If you have lab work done today you will be contacted with your lab results within the next 2 weeks.  If you have not heard from [place] then please contact us. The fastest way to get your results is to register for My Chart.   IF you received an x-ray today, you will receive an invoice from [facility]. Please contact [facility] at [phone] with questions or concerns regarding your invoice.   IF you received labwork today, you will receive an invoice from [facility]. Please contact [facility] at [phone] with questions or concerns regarding your invoice.   Our billing staff will not be able to assist you with questions regarding bills from these companies.  You will be contacted with the lab results as soon as they are available. The fastest way to get your results is to activate your My Chart account. Instructions are located on the last page of this paperwork. If you have not heard from [place] regarding the results in 2 weeks, please contact this office.     Lymphadenopathy  Lymphadenopathy means that your lymph glands are swollen or larger than normal. Lymph glands, also called lymph nodes, are collections of tissue that filter excess fluid, bacteria, viruses, and waste from your bloodstream. They are part of your body's disease-fighting system (immune system), which protects your body from germs. There may be different causes of lymphadenopathy, depending on where it is in your body. Some types go away on their own. Lymphadenopathy can occur anywhere that you have lymph glands, including these areas:  Neck (cervical lymphadenopathy).  Chest (mediastinal lymphadenopathy).  Lungs (hilar lymphadenopathy).  Underarms (axillary lymphadenopathy).  Groin (inguinal lymphadenopathy). When your immune  system responds to germs, infection-fighting cells and fluid build up in your lymph glands. This causes some swelling and enlargement. If the lymph nodes do not go back to normal size after you have an infection or disease, your health care provider may do tests. These tests help to monitor your condition and find the reason why the glands are still swollen and enlarged. Follow these instructions at home:  Get plenty of rest.  Your health care provider may recommend over-the-counter medicines for pain. Take over-the-counter and prescription medicines only as told by your health care provider.  If directed, apply heat to swollen lymph glands as often as told by your health care provider. Use the heat source that your health care provider recommends, such as a moist  heat pack or a heating pad. ? Place a towel between your skin and the heat source. ? Leave the heat on for 20-30 minutes. ? Remove the heat if your skin turns bright red. This is especially important if you are unable to feel pain, heat, or cold. You may have a greater risk of getting burned.  Check your affected lymph glands every day for changes. Check other lymph gland areas as told by your health care provider. Check for changes such as: ? More swelling. ? Sudden increase in size. ? Redness or pain. ? Hardness.  Keep all follow-up visits. This is important.   Contact a health care provider if you have:  Lymph glands that: ? Are still swollen after 2 weeks. ? Have suddenly gotten bigger or the swelling spreads. ? Are red, painful, or hard.  Fluid leaking from the skin near an enlarged lymph gland.  Problems with breathing.  A fever, chills, or night sweats.  Fatigue.  A sore throat.  Pain in your abdomen.  Weight loss. Get help right away if you have:  Severe pain.  Chest pain.  Shortness of breath. These symptoms may represent a serious problem that is an emergency. Do not wait to see if the symptoms will  go away. Get medical help right away. Call your local emergency services (911 in the [place] Do not drive yourself to the hospital. Summary  Lymphadenopathy means that your lymph glands are swollen or larger than normal.  Lymph glands, also called lymph nodes, are collections of tissue that filter excess fluid, bacteria, viruses, and waste from the bloodstream. They are part of your body's disease-fighting system (immune system).  Lymphadenopathy can occur anywhere that you have lymph glands.  If the lymph nodes do not go back to normal size after you have an infection or disease, your health care provider may do tests to monitor your condition and find the reason why the glands are still swollen and enlarged.  Check your affected lymph glands every day for changes. Check other lymph gland areas as told by your health care provider. This information is not intended to replace advice given to you by your health care provider. Make sure you discuss any questions you have with your health care provider. Document Revised: 07/24/2020 Document Reviewed: 07/24/2020 Elsevier Patient Education  2021 Elsevier Inc.      Edwina Barth, MD [facility]

## 2020-12-24 NOTE — Patient Instructions (Addendum)
   If you have lab work done today you will be contacted with your lab results within the next 2 weeks.  If you have not heard from us then please contact us. The fastest way to get your results is to register for My Chart.   IF you received an x-ray today, you will receive an invoice from [facility]. Please contact [facility] at [phone] with questions or concerns regarding your invoice.   IF you received labwork today, you will receive an invoice from [facility]. Please contact [facility] at [phone] with questions or concerns regarding your invoice.   Our billing staff will not be able to assist you with questions regarding bills from these companies.  You will be contacted with the lab results as soon as they are available. The fastest way to get your results is to activate your My Chart account. Instructions are located on the last page of this paperwork. If you have not heard from us regarding the results in 2 weeks, please contact this office.     Lymphadenopathy  Lymphadenopathy means that your lymph glands are swollen or larger than normal. Lymph glands, also called lymph nodes, are collections of tissue that filter excess fluid, bacteria, viruses, and waste from your bloodstream. They are part of your body's disease-fighting system (immune system), which protects your body from germs. There may be different causes of lymphadenopathy, depending on where it is in your body. Some types go away on their own. Lymphadenopathy can occur anywhere that you have lymph glands, including these areas:  Neck (cervical lymphadenopathy).  Chest (mediastinal lymphadenopathy).  Lungs (hilar lymphadenopathy).  Underarms (axillary lymphadenopathy).  Groin (inguinal lymphadenopathy). When your immune system responds to germs, infection-fighting cells and fluid build up in your lymph glands. This causes some swelling and enlargement. If the lymph nodes do not go back to  normal size after you have an infection or disease, your health care provider may do tests. These tests help to monitor your condition and find the reason why the glands are still swollen and enlarged. Follow these instructions at home:  Get plenty of rest.  Your health care provider may recommend over-the-counter medicines for pain. Take over-the-counter and prescription medicines only as told by your health care provider.  If directed, apply heat to swollen lymph glands as often as told by your health care provider. Use the heat source that your health care provider recommends, such as a moist heat pack or a heating pad. ? Place a towel between your skin and the heat source. ? Leave the heat on for 20-30 minutes. ? Remove the heat if your skin turns bright red. This is especially important if you are unable to feel pain, heat, or cold. You may have a greater risk of getting burned.  Check your affected lymph glands every day for changes. Check other lymph gland areas as told by your health care provider. Check for changes such as: ? More swelling. ? Sudden increase in size. ? Redness or pain. ? Hardness.  Keep all follow-up visits. This is important.   Contact a health care provider if you have:  Lymph glands that: ? Are still swollen after 2 weeks. ? Have suddenly gotten bigger or the swelling spreads. ? Are red, painful, or hard.  Fluid leaking from the skin near an enlarged lymph gland.  Problems with breathing.  A fever, chills, or night sweats.  Fatigue.  A sore throat.  Pain in your abdomen.  Weight loss.   Get help right away if you have:  Severe pain.  Chest pain.  Shortness of breath. These symptoms may represent a serious problem that is an emergency. Do not wait to see if the symptoms will go away. Get medical help right away. Call your local emergency services (911 in the [place] Do not drive yourself to the hospital. Summary  Lymphadenopathy means that  your lymph glands are swollen or larger than normal.  Lymph glands, also called lymph nodes, are collections of tissue that filter excess fluid, bacteria, viruses, and waste from the bloodstream. They are part of your body's disease-fighting system (immune system).  Lymphadenopathy can occur anywhere that you have lymph glands.  If the lymph nodes do not go back to normal size after you have an infection or disease, your health care provider may do tests to monitor your condition and find the reason why the glands are still swollen and enlarged.  Check your affected lymph glands every day for changes. Check other lymph gland areas as told by your health care provider. This information is not intended to replace advice given to you by your health care provider. Make sure you discuss any questions you have with your health care provider. Document Revised: 07/24/2020 Document Reviewed: 07/24/2020 Elsevier Patient Education  2021 Elsevier Inc.  

## 2021-02-04 ENCOUNTER — Ambulatory Visit (INDEPENDENT_AMBULATORY_CARE_PROVIDER_SITE_OTHER): Payer: BC Managed Care – PPO | Admitting: Emergency Medicine

## 2021-02-04 ENCOUNTER — Other Ambulatory Visit: Payer: Self-pay

## 2021-02-04 ENCOUNTER — Encounter: Payer: Self-pay | Admitting: Emergency Medicine

## 2021-02-04 VITALS — BP 122/82 | HR 92 | Temp 98.4°F | Ht 69.0 in | Wt 212.4 lb

## 2021-02-04 DIAGNOSIS — M7989 Other specified soft tissue disorders: Secondary | ICD-10-CM

## 2021-02-04 NOTE — Patient Instructions (Signed)

## 2021-02-04 NOTE — Progress Notes (Signed)
Carlos Lambert 40 y.o.   Chief Complaint  Patient presents with  . Lymphadenopathy    Per patient gotten worsen within 10 days    HISTORY OF PRESENT ILLNESS: This is a 40 y.o. male complaining of soft and nontender mass to left upper inner thigh for several weeks. The last 10 days it seems like it is coming to the surface.  No other associated symptoms.  No injuries. No other complaints or medical concerns today.  HPI   Prior to Admission medications   Medication Sig Start Date End Date Taking? Authorizing Provider  Calcium Carbonate-Vit D-Min (CALCIUM 1200 PO) Take by mouth.   Yes [provider]  rosuvastatin (CRESTOR) 20 MG tablet Take 1 tablet (20 mg total) by mouth daily. 03/05/20  Yes Verbon Giangregorio, Eilleen KempfMiguel Jose, MD  mupirocin cream (BACTROBAN) 2 % Apply 1 application topically 2 (two) times daily. Patient not taking: Reported on 02/04/2021 12/24/20   Georgina QuintSagardia, Basem Yannuzzi Jose, MD  pantoprazole (PROTONIX) 40 MG tablet Take 1 tablet (40 mg total) by mouth daily. Patient not taking: Reported on 02/04/2021 05/28/20   Georgina QuintSagardia, Dempsy Damiano Jose, MD    No Known Allergies  Patient Active Problem List   Diagnosis Date Noted  . Dyslipidemia 03/05/2020  . Prediabetes 03/05/2020  . Elevated triglycerides with high cholesterol 04/02/2018  . History of prediabetes 04/02/2018    No past medical history on file.  Past Surgical History:  Procedure Laterality Date  . KNEE SURGERY      Social History   Socioeconomic History  . Marital status: Divorced    Spouse name: Not on file  . Number of children: 1  . Years of education: Not on file  . Highest education level: Not on file  Occupational History  . Not on file  Tobacco Use  . Smoking status: Former Games developermoker  . Smokeless tobacco: Never Used  Vaping Use  . Vaping Use: Never used  Substance and Sexual Activity  . Alcohol use: Yes  . Drug use: No  . Sexual activity: Not on file  Other Topics Concern  . Not on file  Social  History Narrative  . Not on file   Social Determinants of Health   Financial Resource Strain: Not on file  Food Insecurity: Not on file  Transportation Needs: Not on file  Physical Activity: Not on file  Stress: Not on file  Social Connections: Not on file  Intimate Partner Violence: Not on file    Family History  Problem Relation Age of Onset  . Hypertension Father   . Hyperlipidemia Father   . Cancer Mother        LUNG     Review of Systems  Constitutional: Negative.  Negative for chills and fever.  HENT: Negative.  Negative for congestion and sore throat.   Respiratory: Negative.  Negative for cough and shortness of breath.   Cardiovascular: Negative.  Negative for chest pain and palpitations.  Gastrointestinal: Negative for abdominal pain, nausea and vomiting.  Genitourinary: Negative.  Negative for dysuria and hematuria.  Skin: Negative.   Neurological: Negative.  Negative for dizziness and headaches.  All other systems reviewed and are negative.  Today's Vitals   02/04/21 1344  BP: 122/82  Pulse: 92  Temp: 98.4 F (36.9 C)  TempSrc: Oral  SpO2: 98%  Weight: 212 lb 6.4 oz (96.3 kg)  Height: 5\' 9"  (1.753 m)   Body mass index is 31.37 kg/m.   Physical Exam Vitals reviewed.  Constitutional:  Appearance: Normal appearance.  HENT:     Head: Normocephalic.  Eyes:     Extraocular Movements: Extraocular movements intact.     Pupils: Pupils are equal, round, and reactive to light.  Cardiovascular:     Rate and Rhythm: Normal rate.  Pulmonary:     Effort: Pulmonary effort is normal.  Skin:    General: Skin is warm and dry.     Comments: Left upper inner thigh: Soft tissue mass.  Nontender.  Nonfluctuant.  No erythema.  No lymphadenopathy.  Neurological:     General: No focal deficit present.     Mental Status: He is alert and oriented to person, place, and time.  Psychiatric:        Mood and Affect: Mood normal.        Behavior: Behavior normal.       ASSESSMENT & PLAN: Jaleal was seen today for lymphadenopathy.  Diagnoses and all orders for this visit:  Mass of soft tissue of left lower extremity -     [place] LT LOWER EXTREM LTD SOFT TISSUE NON VASCULAR; Future    Patient Instructions   Health Maintenance, Male Adopting a healthy lifestyle and getting preventive care are important in promoting health and wellness. Ask your health care provider about:  The right schedule for you to have regular tests and exams.  Things you can do on your own to prevent diseases and keep yourself healthy. What should I know about diet, weight, and exercise? Eat a healthy diet  Eat a diet that includes plenty of vegetables, fruits, low-fat dairy products, and lean protein.  Do not eat a lot of foods that are high in solid fats, added sugars, or sodium.   Maintain a healthy weight Body mass index (BMI) is a measurement that can be used to identify possible weight problems. It estimates body fat based on height and weight. Your health care provider can help determine your BMI and help you achieve or maintain a healthy weight. Get regular exercise Get regular exercise. This is one of the most important things you can do for your health. Most adults should:  Exercise for at least 150 minutes each week. The exercise should increase your heart rate and make you sweat (moderate-intensity exercise).  Do strengthening exercises at least twice a week. This is in addition to the moderate-intensity exercise.  Spend less time sitting. Even light physical activity can be beneficial. Watch cholesterol and blood lipids Have your blood tested for lipids and cholesterol at 38 years of age, then have this test every 5 years. You may need to have your cholesterol levels checked more often if:  Your lipid or cholesterol levels are high.  You are older than 38 years of age.  You are at high risk for heart disease. What should I know about cancer  screening? Many types of cancers can be detected early and may often be prevented. Depending on your health history and family history, you may need to have cancer screening at various ages. This may include screening for:  Colorectal cancer.  Prostate cancer.  Skin cancer.  Lung cancer. What should I know about heart disease, diabetes, and high blood pressure? Blood pressure and heart disease  High blood pressure causes heart disease and increases the risk of stroke. This is more likely to develop in people who have high blood pressure readings, are of African descent, or are overweight.  Talk with your health care provider about your target blood pressure readings.  Have your blood pressure checked: ? Every 3-5 years if you are 24-16 years of age. ? Every year if you are 39 years old or older.  If you are between the ages of 77 and 36 and are a current or former smoker, ask your health care provider if you should have a one-time screening for abdominal aortic aneurysm (AAA). Diabetes Have regular diabetes screenings. This checks your fasting blood sugar level. Have the screening done:  Once every three years after age 39 if you are at a normal weight and have a low risk for diabetes.  More often and at a younger age if you are overweight or have a high risk for diabetes. What should I know about preventing infection? Hepatitis B If you have a higher risk for hepatitis B, you should be screened for this virus. Talk with your health care provider to find out if you are at risk for hepatitis B infection. Hepatitis C Blood testing is recommended for:  Everyone born from [birth year] through 1963.  Anyone with known risk factors for hepatitis C. Sexually transmitted infections (STIs)  You should be screened each year for STIs, including gonorrhea and chlamydia, if: ? You are sexually active and are younger than 38 years of age. ? You are older than 38 years of age and your health care  provider tells you that you are at risk for this type of infection. ? Your sexual activity has changed since you were last screened, and you are at increased risk for chlamydia or gonorrhea. Ask your health care provider if you are at risk.  Ask your health care provider about whether you are at high risk for HIV. Your health care provider may recommend a prescription medicine to help prevent HIV infection. If you choose to take medicine to prevent HIV, you should first get tested for HIV. You should then be tested every 3 months for as long as you are taking the medicine. Follow these instructions at home: Lifestyle  Do not use any products that contain nicotine or tobacco, such as cigarettes, e-cigarettes, and chewing tobacco. If you need help quitting, ask your health care provider.  Do not use street drugs.  Do not share needles.  Ask your health care provider for help if you need support or information about quitting drugs. Alcohol use  Do not drink alcohol if your health care provider tells you not to drink.  If you drink alcohol: ? Limit how much you have to 0-2 drinks a day. ? Be aware of how much alcohol is in your drink. In the [place] one drink equals one 12 oz bottle of beer (355 mL), one 5 oz glass of wine (148 mL), or one 1 oz glass of hard liquor (44 mL). General instructions  Schedule regular health, dental, and eye exams.  Stay current with your vaccines.  Tell your health care provider if: ? You often feel depressed. ? You have ever been abused or do not feel safe at home. Summary  Adopting a healthy lifestyle and getting preventive care are important in promoting health and wellness.  Follow your health care provider's instructions about healthy diet, exercising, and getting tested or screened for diseases.  Follow your health care provider's instructions on monitoring your cholesterol and blood pressure. This information is not intended to replace advice given  to you by your health care provider. Make sure you discuss any questions you have with your health care provider. Document Revised: 09/21/2018 Document Reviewed: 09/21/2018  Elsevier Patient Education  2021 Elsevier Inc.      Edwina Barth, MD [facility]

## 2021-02-07 ENCOUNTER — Ambulatory Visit
Admission: RE | Admit: 2021-02-07 | Discharge: 2021-02-07 | Disposition: A | Discharge status: Home / Self Care | Payer: BC Managed Care – PPO | Source: Ambulatory Visit | Attending: Emergency Medicine | Admitting: Emergency Medicine

## 2021-02-07 DIAGNOSIS — R2242 Localized swelling, mass and lump, left lower limb: Secondary | ICD-10-CM | POA: Diagnosis not present

## 2021-02-07 DIAGNOSIS — R2232 Localized swelling, mass and lump, left upper limb: Secondary | ICD-10-CM | POA: Diagnosis not present

## 2021-02-07 DIAGNOSIS — M7989 Other specified soft tissue disorders: Secondary | ICD-10-CM

## 2021-02-08 ENCOUNTER — Other Ambulatory Visit: Payer: Self-pay | Admitting: Emergency Medicine

## 2021-02-08 DIAGNOSIS — M7989 Other specified soft tissue disorders: Secondary | ICD-10-CM

## 2021-02-08 NOTE — Progress Notes (Signed)
Call patient please.  Ultrasound shows soft tissue mass of uncertain etiology.  Radiologist recommends MRI for further identification.  Ordered today.  Thanks.

## 2021-02-10 ENCOUNTER — Other Ambulatory Visit: Payer: Self-pay | Admitting: Emergency Medicine

## 2021-02-10 DIAGNOSIS — M7989 Other specified soft tissue disorders: Secondary | ICD-10-CM

## 2021-02-22 ENCOUNTER — Other Ambulatory Visit: Payer: Self-pay

## 2021-02-22 ENCOUNTER — Ambulatory Visit
Admission: RE | Admit: 2021-02-22 | Discharge: 2021-02-22 | Disposition: A | Discharge status: Home / Self Care | Payer: BC Managed Care – PPO | Source: Ambulatory Visit | Attending: Emergency Medicine | Admitting: Emergency Medicine

## 2021-02-22 DIAGNOSIS — R2242 Localized swelling, mass and lump, left lower limb: Secondary | ICD-10-CM | POA: Diagnosis not present

## 2021-02-22 DIAGNOSIS — M7989 Other specified soft tissue disorders: Secondary | ICD-10-CM

## 2021-02-22 MED ORDER — GADOBENATE DIMEGLUMINE 529 MG/ML IV SOLN
15.0000 mL | Freq: Once | INTRAVENOUS | Status: AC | PRN
  Administered 2021-02-22: 15 mL via INTRAVENOUS

## 2021-02-24 NOTE — Progress Notes (Signed)
Benign findings. Schedule follow up OV please. Thanks.

## 2021-03-11 ENCOUNTER — Other Ambulatory Visit: Payer: Self-pay

## 2021-03-11 ENCOUNTER — Ambulatory Visit: Payer: Self-pay | Admitting: Emergency Medicine

## 2021-03-11 ENCOUNTER — Ambulatory Visit (INDEPENDENT_AMBULATORY_CARE_PROVIDER_SITE_OTHER): Payer: BC Managed Care – PPO | Admitting: Emergency Medicine

## 2021-03-11 ENCOUNTER — Encounter: Payer: Self-pay | Admitting: Emergency Medicine

## 2021-03-11 VITALS — BP 126/70 | HR 71 | Temp 98.1°F | Ht 69.0 in | Wt 213.0 lb

## 2021-03-11 DIAGNOSIS — M7989 Other specified soft tissue disorders: Secondary | ICD-10-CM | POA: Diagnosis not present

## 2021-03-11 NOTE — Assessment & Plan Note (Signed)
Soft tissue mass since 2008.  No signs of infection.  No malignant findings on MRI.  Due to erratic and abnormal behavior will obtain surgical consult for possible biopsy/removal.

## 2021-03-11 NOTE — Patient Instructions (Signed)

## 2021-03-11 NOTE — Progress Notes (Signed)
Carlos Lambert 40 y.o.   Chief Complaint  Patient presents with  . Hyperlipidemia    Pt here for cholesterol check    HISTORY OF PRESENT ILLNESS: This is a 40 y.o. male here for follow-up of left upper thigh mass.  Recent MRI showed the following: MR FEMUR LEFT W WO CONTRAST  Result Date: 02/24/2021 CLINICAL DATA:  Soft tissue mass, thigh, US/xray nondiagnostic EXAM: MR OF THE LEFT LOWER EXTREMITY WITHOUT AND WITH CONTRAST TECHNIQUE: Multiplanar, multisequence MR imaging of the left lower extremity was performed both before and after administration of intravenous contrast. CONTRAST:  15mL MULTIHANCE GADOBENATE DIMEGLUMINE 529 MG/ML IV SOLN COMPARISON:  Ultrasound 02/07/2021 FINDINGS: Bones/Joint/Cartilage The cortex is intact. There is no significant marrow signal alteration. There is no focal bone lesion. Ligaments Unremarkable ligamentous structures of the left hip, partially evaluated. Muscles and Tendons There is no acute muscle or tendon injury. Soft tissues Within the inner aspect of the upper medial thigh, there is subcutaneous ill-defined T2 hyperintense/T1 hypointense area with likely areas of internal fat. There appears to be a tract leading to the skin. This area demonstrates mild enhancement and overall measures approximately 7.7 x 3.0 x 1.3 cm (T1 coronal fat-sat image 33, axial T1 post-contrast image 13). This area abuts the deep fascia but does not appear to involve it blister for the deeper musculature. There is no well-defined fluid collection. There is a small fat containing left inguinal hernia. IMPRESSION: Ill-defined region of enhancing soft tissue in the subcutaneous tissues of the upper medial left thigh, with apparent sinus tract extending to the skin surface. This is favored to represent an infectious/inflammatory process. Recommend clinical follow-up and repeat ultrasound if there is progression. Electronically Signed   By: Carlos RenshawJacob  Lambert   On: 02/24/2021 09:09      HPI   Prior to Admission medications   Medication Sig Start Date End Date Taking? Authorizing Provider  Calcium Carbonate-Vit D-Min (CALCIUM 1200 PO) Take by mouth.   Yes [provider]  rosuvastatin (CRESTOR) 20 MG tablet Take 1 tablet (20 mg total) by mouth daily. 03/05/20  Yes Carlos Arp, Eilleen KempfMiguel Jose, MD  mupirocin cream (BACTROBAN) 2 % Apply 1 application topically 2 (two) times daily. Patient not taking: No sig reported 12/24/20   Carlos QuintSagardia, Jacquel Redditt Jose, MD  pantoprazole (PROTONIX) 40 MG tablet Take 1 tablet (40 mg total) by mouth daily. Patient not taking: No sig reported 05/28/20   Carlos QuintSagardia, Anberlin Diez Jose, MD    No Known Allergies  Patient Active Problem List   Diagnosis Date Noted  . Dyslipidemia 03/05/2020  . Prediabetes 03/05/2020  . Elevated triglycerides with high cholesterol 04/02/2018  . History of prediabetes 04/02/2018    No past medical history on file.  Past Surgical History:  Procedure Laterality Date  . KNEE SURGERY      Social History   Socioeconomic History  . Marital status: Divorced    Spouse name: Not on file  . Number of children: 1  . Years of education: Not on file  . Highest education level: Not on file  Occupational History  . Not on file  Tobacco Use  . Smoking status: Former Games developermoker  . Smokeless tobacco: Never Used  Vaping Use  . Vaping Use: Never used  Substance and Sexual Activity  . Alcohol use: Yes  . Drug use: No  . Sexual activity: Not on file  Other Topics Concern  . Not on file  Social History Narrative  . Not on file   Social  Determinants of Health   Financial Resource Strain: Not on file  Food Insecurity: Not on file  Transportation Needs: Not on file  Physical Activity: Not on file  Stress: Not on file  Social Connections: Not on file  Intimate Partner Violence: Not on file    Family History  Problem Relation Age of Onset  . Hypertension Father   . Hyperlipidemia Father   . Cancer Mother         LUNG     Review of Systems  Constitutional: Negative.  Negative for chills and fever.  HENT: Negative.  Negative for congestion and sore throat.   Respiratory: Negative.  Negative for cough and shortness of breath.   Cardiovascular: Negative.  Negative for chest pain.  Gastrointestinal: Negative for abdominal pain, nausea and vomiting.  Skin: Negative.   Neurological: Negative.  Negative for dizziness and headaches.  All other systems reviewed and are negative.  Today's Vitals   03/11/21 1401  BP: 126/70  Pulse: 71  Temp: 98.1 F (36.7 C)  TempSrc: Oral  SpO2: 98%  Weight: 213 lb (96.6 kg)  Height: 5\' 9"  (1.753 m)   Body mass index is 31.45 kg/m.   Physical Exam Vitals reviewed.  Constitutional:      Appearance: Normal appearance.  HENT:     Head: Normocephalic.  Eyes:     Extraocular Movements: Extraocular movements intact.     Pupils: Pupils are equal, round, and reactive to light.  Cardiovascular:     Rate and Rhythm: Normal rate.  Pulmonary:     Effort: Pulmonary effort is normal.  Musculoskeletal:     Cervical back: Normal range of motion.  Skin:    General: Skin is warm and dry.  Neurological:     General: No focal deficit present.     Mental Status: He is alert and oriented to person, place, and time.  Psychiatric:        Mood and Affect: Mood normal.        Behavior: Behavior normal.      ASSESSMENT & PLAN: [name] was seen today for hyperlipidemia.  Diagnoses and all orders for this visit:  Soft tissue mass Comments: Left upper thigh Orders: -     Ambulatory referral to General Surgery    Patient Instructions   Health Maintenance, Male Adopting a healthy lifestyle and getting preventive care are important in promoting health and wellness. Ask your health care provider about:  The right schedule for you to have regular tests and exams.  Things you can do on your own to prevent diseases and keep yourself healthy. What should I know  about diet, weight, and exercise? Eat a healthy diet  Eat a diet that includes plenty of vegetables, fruits, low-fat dairy products, and lean protein.  Do not eat a lot of foods that are high in solid fats, added sugars, or sodium.   Maintain a healthy weight Body mass index (BMI) is a measurement that can be used to identify possible weight problems. It estimates body fat based on height and weight. Your health care provider can help determine your BMI and help you achieve or maintain a healthy weight. Get regular exercise Get regular exercise. This is one of the most important things you can do for your health. Most adults should:  Exercise for at least 150 minutes each week. The exercise should increase your heart rate and make you sweat (moderate-intensity exercise).  Do strengthening exercises at least twice a week. This is in  addition to the moderate-intensity exercise.  Spend less time sitting. Even light physical activity can be beneficial. Watch cholesterol and blood lipids Have your blood tested for lipids and cholesterol at 38 years of age, then have this test every 5 years. You may need to have your cholesterol levels checked more often if:  Your lipid or cholesterol levels are high.  You are older than 38 years of age.  You are at high risk for heart disease. What should I know about cancer screening? Many types of cancers can be detected early and may often be prevented. Depending on your health history and family history, you may need to have cancer screening at various ages. This may include screening for:  Colorectal cancer.  Prostate cancer.  Skin cancer.  Lung cancer. What should I know about heart disease, diabetes, and high blood pressure? Blood pressure and heart disease  High blood pressure causes heart disease and increases the risk of stroke. This is more likely to develop in people who have high blood pressure readings, are of African descent, or are  overweight.  Talk with your health care provider about your target blood pressure readings.  Have your blood pressure checked: ? Every 3-5 years if you are 34-33 years of age. ? Every year if you are 67 years old or older.  If you are between the ages of 74 and 60 and are a current or former smoker, ask your health care provider if you should have a one-time screening for abdominal aortic aneurysm (AAA). Diabetes Have regular diabetes screenings. This checks your fasting blood sugar level. Have the screening done:  Once every three years after age 62 if you are at a normal weight and have a low risk for diabetes.  More often and at a younger age if you are overweight or have a high risk for diabetes. What should I know about preventing infection? Hepatitis B If you have a higher risk for hepatitis B, you should be screened for this virus. Talk with your health care provider to find out if you are at risk for hepatitis B infection. Hepatitis C Blood testing is recommended for:  Everyone born from [birth year] through 1963.  Anyone with known risk factors for hepatitis C. Sexually transmitted infections (STIs)  You should be screened each year for STIs, including gonorrhea and chlamydia, if: ? You are sexually active and are younger than 37 years of age. ? You are older than 38 years of age and your health care provider tells you that you are at risk for this type of infection. ? Your sexual activity has changed since you were last screened, and you are at increased risk for chlamydia or gonorrhea. Ask your health care provider if you are at risk.  Ask your health care provider about whether you are at high risk for HIV. Your health care provider may recommend a prescription medicine to help prevent HIV infection. If you choose to take medicine to prevent HIV, you should first get tested for HIV. You should then be tested every 3 months for as long as you are taking the medicine. Follow these  instructions at home: Lifestyle  Do not use any products that contain nicotine or tobacco, such as cigarettes, e-cigarettes, and chewing tobacco. If you need help quitting, ask your health care provider.  Do not use street drugs.  Do not share needles.  Ask your health care provider for help if you need support or information about quitting drugs. Alcohol  use  Do not drink alcohol if your health care provider tells you not to drink.  If you drink alcohol: ? Limit how much you have to 0-2 drinks a day. ? Be aware of how much alcohol is in your drink. In the [place] one drink equals one 12 oz bottle of beer (355 mL), one 5 oz glass of wine (148 mL), or one 1 oz glass of hard liquor (44 mL). General instructions  Schedule regular health, dental, and eye exams.  Stay current with your vaccines.  Tell your health care provider if: ? You often feel depressed. ? You have ever been abused or do not feel safe at home. Summary  Adopting a healthy lifestyle and getting preventive care are important in promoting health and wellness.  Follow your health care provider's instructions about healthy diet, exercising, and getting tested or screened for diseases.  Follow your health care provider's instructions on monitoring your cholesterol and blood pressure. This information is not intended to replace advice given to you by your health care provider. Make sure you discuss any questions you have with your health care provider. Document Revised: 09/21/2018 Document Reviewed: 09/21/2018 Elsevier Patient Education  2021 Elsevier Inc.      Edwina Barth, MD [facility]

## 2021-04-21 ENCOUNTER — Ambulatory Visit: Payer: Self-pay | Admitting: Surgery

## 2021-04-21 DIAGNOSIS — R7303 Prediabetes: Secondary | ICD-10-CM | POA: Diagnosis not present

## 2021-04-21 DIAGNOSIS — R2231 Localized swelling, mass and lump, right upper limb: Secondary | ICD-10-CM | POA: Diagnosis not present

## 2021-04-21 DIAGNOSIS — M7989 Other specified soft tissue disorders: Secondary | ICD-10-CM | POA: Diagnosis not present

## 2021-05-10 ENCOUNTER — Emergency Department (INDEPENDENT_AMBULATORY_CARE_PROVIDER_SITE_OTHER)
Admission: EM | Admit: 2021-05-10 | Discharge: 2021-05-10 | Disposition: A | Discharge status: Home / Self Care | Payer: BC Managed Care – PPO | Source: Home / Self Care

## 2021-05-10 ENCOUNTER — Other Ambulatory Visit (HOSPITAL_COMMUNITY)
Admission: RE | Admit: 2021-05-10 | Discharge: 2021-05-10 | Disposition: A | Discharge status: Home / Self Care | Payer: BC Managed Care – PPO | Source: Ambulatory Visit | Attending: Family Medicine | Admitting: Family Medicine

## 2021-05-10 ENCOUNTER — Other Ambulatory Visit: Payer: Self-pay

## 2021-05-10 ENCOUNTER — Emergency Department: Admit: 2021-05-10 | Payer: Self-pay

## 2021-05-10 DIAGNOSIS — Z113 Encounter for screening for infections with a predominantly sexual mode of transmission: Secondary | ICD-10-CM | POA: Insufficient documentation

## 2021-05-10 DIAGNOSIS — R350 Frequency of micturition: Secondary | ICD-10-CM | POA: Diagnosis not present

## 2021-05-10 DIAGNOSIS — R300 Dysuria: Secondary | ICD-10-CM

## 2021-05-10 LAB — POCT URINALYSIS DIP (MANUAL ENTRY)
Bilirubin, UA: NEGATIVE
Blood, UA: NEGATIVE
Glucose, UA: NEGATIVE mg/dL
Ketones, POC UA: NEGATIVE mg/dL
Leukocytes, UA: NEGATIVE
Nitrite, UA: NEGATIVE
Protein Ur, POC: NEGATIVE mg/dL
Spec Grav, UA: 1.02 (ref 1.010–1.025)
Urobilinogen, UA: 0.2 E.U./dL
pH, UA: 6 (ref 5.0–8.0)

## 2021-05-10 NOTE — Discharge Instructions (Addendum)
Advised patient we will follow-up with him once urine culture and abdomen swab results return.

## 2021-05-10 NOTE — ED Triage Notes (Signed)
Pt states that he has some urinary frequency and burning of his kidneys. X4-5 days

## 2021-05-10 NOTE — ED Provider Notes (Signed)
[facility]    CSN: [id number] Arrival date & time: 05/10/21  1029      History   Chief Complaint Chief Complaint  Patient presents with   Urinary Frequency    Pt states that he has some urinary frequency and burning of his kidneys. X4-5 days    HPI Carlos Lambert is a 40 y.o. male.   HPI 40 year old male presents with urinary frequency and burning of kidneys for 4-5 days.  Patient denies flank pain or penile discharge.   History reviewed. No pertinent past medical history.  Patient Active Problem List   Diagnosis Date Noted   Soft tissue mass 03/11/2021   Dyslipidemia 03/05/2020   Prediabetes 03/05/2020   Elevated triglycerides with high cholesterol 04/02/2018   History of prediabetes 04/02/2018    Past Surgical History:  Procedure Laterality Date   KNEE SURGERY         Home Medications    Prior to Admission medications   Medication Sig Start Date End Date Taking? Authorizing Provider  Calcium Carbonate-Vit D-Min (CALCIUM 1200 PO) Take by mouth.   Yes [provider]  mupirocin cream (BACTROBAN) 2 % Apply 1 application topically 2 (two) times daily. 12/24/20  Yes Sagardia, Eilleen Kempf, MD  pantoprazole (PROTONIX) 40 MG tablet Take 1 tablet (40 mg total) by mouth daily. 05/28/20  Yes Sagardia, Eilleen Kempf, MD  rosuvastatin (CRESTOR) 20 MG tablet Take 1 tablet (20 mg total) by mouth daily. 03/05/20  Yes Sagardia, Eilleen Kempf, MD    Family History Family History  Problem Relation Age of Onset   Hypertension Father    Hyperlipidemia Father    Cancer Mother        LUNG    Social History Social History   Tobacco Use   Smoking status: Former   Smokeless tobacco: Never  Building services engineer Use: Never used  Substance Use Topics   Alcohol use: Yes   Drug use: No     Allergies   Patient has no known allergies.   Review of Systems Review of Systems  Genitourinary:  Positive for dysuria and urgency.  All other systems reviewed and  are negative.   Physical Exam Triage Vital Signs ED Triage Vitals  Enc Vitals Group     BP 05/10/21 1053 121/79     Pulse Rate 05/10/21 1053 (!) 59     Resp --      Temp 05/10/21 1053 98.6 F (37 C)     Temp Source 05/10/21 1053 Oral     SpO2 05/10/21 1053 97 %     Weight 05/10/21 1051 205 lb (93 kg)     Height 05/10/21 1051 5\' 9"  (1.753 m)     Head Circumference --      Peak Flow --      Pain Score 05/10/21 1051 0     Pain Loc --      Pain Edu? --      Excl. in GC? --    No data found.  Updated Vital Signs BP 121/79 (BP Location: Right Arm)   Pulse (!) 59   Temp 98.6 F (37 C) (Oral)   Ht 5\' 9"  (1.753 m)   Wt 205 lb (93 kg)   SpO2 97%   BMI 30.27 kg/m    Physical Exam Vitals and nursing note reviewed.  Constitutional:      General: He is not in acute distress.    Appearance: Normal appearance. He is normal weight. He  is not ill-appearing.  HENT:     Head: Normocephalic and atraumatic.     Mouth/Throat:     Mouth: Mucous membranes are moist.     Pharynx: Oropharynx is clear.  Eyes:     Extraocular Movements: Extraocular movements intact.     Conjunctiva/sclera: Conjunctivae normal.     Pupils: Pupils are equal, round, and reactive to light.  Cardiovascular:     Rate and Rhythm: Normal rate and regular rhythm.     Pulses: Normal pulses.     Heart sounds: Normal heart sounds.  Pulmonary:     Effort: Pulmonary effort is normal.     Breath sounds: Normal breath sounds. No wheezing, rhonchi or rales.  Abdominal:     Tenderness: There is no right CVA tenderness or left CVA tenderness.  Musculoskeletal:        General: Normal range of motion.     Cervical back: Normal range of motion and neck supple.  Lymphadenopathy:     Cervical: No cervical adenopathy.  Skin:    General: Skin is warm and dry.  Neurological:     General: No focal deficit present.     Mental Status: He is alert and oriented to person, place, and time.  Psychiatric:        Mood and  Affect: Mood normal.        Behavior: Behavior normal.     UC Treatments / Results  Labs (all labs ordered are listed, but only abnormal results are displayed) Labs Reviewed  POCT URINALYSIS DIP (MANUAL ENTRY) - Normal  URINE CULTURE  CERVICOVAGINAL ANCILLARY ONLY    EKG   Radiology No results found.  Procedures Procedures (including critical care time)  Medications Ordered in UC Medications - No data to display  Initial Impression / Assessment and Plan / UC Course  I have reviewed the triage vital signs and the nursing notes.  Pertinent labs & imaging results that were available during my care of the patient were reviewed by me and considered in my medical decision making (see chart for details).     MDM: 1.  Urinary frequency-UA within normal limits/unremarkable, urine culture ordered, 2.  Dysuria-Aptima swab for GC/chlamydia and trichomonas ordered, advised patient we would follow-up with him once lab results return.  Patient discharged home, hemodynamically stable. Final Clinical Impressions(s) / UC Diagnoses   Final diagnoses:  Urinary frequency  Dysuria     Discharge Instructions      Advised patient we will follow-up with him once urine culture and abdomen swab results return.     ED Prescriptions   None    PDMP not reviewed this encounter.   Trevor Iha, FNP 05/10/21 1217

## 2021-05-12 LAB — URINE CULTURE
MICRO NUMBER:: 12183884
Result:: NO GROWTH
SPECIMEN QUALITY:: ADEQUATE

## 2021-05-13 ENCOUNTER — Encounter: Payer: Self-pay | Admitting: Emergency Medicine

## 2021-05-13 ENCOUNTER — Ambulatory Visit (INDEPENDENT_AMBULATORY_CARE_PROVIDER_SITE_OTHER): Payer: BC Managed Care – PPO | Admitting: Emergency Medicine

## 2021-05-13 ENCOUNTER — Other Ambulatory Visit: Payer: Self-pay

## 2021-05-13 VITALS — BP 114/66 | HR 71 | Temp 98.3°F | Ht 69.0 in | Wt 215.0 lb

## 2021-05-13 DIAGNOSIS — R350 Frequency of micturition: Secondary | ICD-10-CM

## 2021-05-13 LAB — POCT URINALYSIS DIP (MANUAL ENTRY)
Bilirubin, UA: NEGATIVE
Blood, UA: NEGATIVE
Glucose, UA: NEGATIVE mg/dL
Ketones, POC UA: NEGATIVE mg/dL
Leukocytes, UA: NEGATIVE
Nitrite, UA: NEGATIVE
Protein Ur, POC: NEGATIVE mg/dL
Spec Grav, UA: 1.015 (ref 1.010–1.025)
Urobilinogen, UA: 0.2 E.U./dL
pH, UA: 6 (ref 5.0–8.0)

## 2021-05-13 LAB — CERVICOVAGINAL ANCILLARY ONLY
Chlamydia: NEGATIVE
Comment: NEGATIVE
Comment: NEGATIVE
Comment: NORMAL
Neisseria Gonorrhea: NEGATIVE
Trichomonas: NEGATIVE

## 2021-05-13 NOTE — Progress Notes (Signed)
Carlos Lambert 40 y.o.   Chief Complaint  Patient presents with   Office Visit    Possible UTI; patient states that he had his urine checked at [facility] on Saturday and it was normal    HISTORY OF PRESENT ILLNESS: This is a 40 y.o. male complaining of urinary frequency that started about 5 days ago and today is much better. Went to urgent care center on 05/10/2021.  Normal urinalysis and negative urine culture. Asymptomatic today.  Much better. No other complaints or medical concerns today.  HPI   Prior to Admission medications   Medication Sig Start Date End Date Taking? Authorizing Provider  Calcium Carbonate-Vit D-Min (CALCIUM 1200 PO) Take by mouth.   Yes [provider]  mupirocin cream (BACTROBAN) 2 % Apply 1 application topically 2 (two) times daily. 12/24/20  Yes [facility], Carlos Kempf, MD  pantoprazole (PROTONIX) 40 MG tablet Take 1 tablet (40 mg total) by mouth daily. 05/28/20  Yes [facility], Carlos Kempf, MD  rosuvastatin (CRESTOR) 20 MG tablet Take 1 tablet (20 mg total) by mouth daily. 03/05/20  Yes Carlos Quint, MD    No Known Allergies  Patient Active Problem List   Diagnosis Date Noted   Soft tissue mass 03/11/2021   Dyslipidemia 03/05/2020   Prediabetes 03/05/2020   Elevated triglycerides with high cholesterol 04/02/2018   History of prediabetes 04/02/2018    History reviewed. No pertinent past medical history.  Past Surgical History:  Procedure Laterality Date   KNEE SURGERY      Social History   Socioeconomic History   Marital status: Divorced    Spouse name: Not on file   Number of children: 1   Years of education: Not on file   Highest education level: Not on file  Occupational History   Not on file  Tobacco Use   Smoking status: Former   Smokeless tobacco: Never  Vaping Use   Vaping Use: Never used  Substance and Sexual Activity   Alcohol use: Yes   Drug use: No   Sexual activity: Not on file  Other Topics Concern   Not on  file  Social History Narrative   Not on file   Social Determinants of Health   Financial Resource Strain: Not on file  Food Insecurity: Not on file  Transportation Needs: Not on file  Physical Activity: Not on file  Stress: Not on file  Social Connections: Not on file  Intimate Partner Violence: Not on file    Family History  Problem Relation Age of Onset   Hypertension Father    Hyperlipidemia Father    Cancer Mother        LUNG     Review of Systems  Constitutional: Negative.  Negative for chills and fever.  HENT: Negative.  Negative for congestion and sore throat.   Respiratory: Negative.  Negative for cough and shortness of breath.   Cardiovascular:  Negative for chest pain and palpitations.  Gastrointestinal:  Negative for abdominal pain, diarrhea, nausea and vomiting.  Genitourinary:  Positive for frequency. Negative for dysuria, flank pain, hematuria and urgency.  Musculoskeletal:  Positive for back pain.  Skin: Negative.  Negative for rash.  Neurological:  Negative for dizziness and headaches.  All other systems reviewed and are negative.  Today's Vitals   05/13/21 1004  BP: 114/66  Pulse: 71  Temp: 98.3 F (36.8 C)  TempSrc: Oral  SpO2: 97%  Weight: 215 lb (97.5 kg)  Height: 5\' 9"  (1.753 m)   Body  mass index is 31.75 kg/m.  Physical Exam Vitals reviewed.  Constitutional:      Appearance: Normal appearance.  HENT:     Head: Normocephalic.  Eyes:     Extraocular Movements: Extraocular movements intact.     Pupils: Pupils are equal, round, and reactive to light.  Cardiovascular:     Rate and Rhythm: Normal rate.  Pulmonary:     Effort: Pulmonary effort is normal.  Abdominal:     General: There is no distension.     Palpations: Abdomen is soft.     Tenderness: There is no abdominal tenderness.  Musculoskeletal:        General: Normal range of motion.     Cervical back: Normal range of motion.  Skin:    General: Skin is warm and dry.      Capillary Refill: Capillary refill takes less than 2 seconds.  Neurological:     General: No focal deficit present.     Mental Status: He is alert and oriented to person, place, and time.  Psychiatric:        Mood and Affect: Mood normal.        Behavior: Behavior normal.   Results for orders placed or performed in visit on 05/13/21 (from the past 24 hour(s))  POCT urinalysis dipstick     Status: Normal   Collection Time: 05/13/21 11:11 AM  Result Value Ref Range   Color, UA yellow yellow   Clarity, UA clear clear   Glucose, UA negative negative mg/dL   Bilirubin, UA negative negative   Ketones, POC UA negative negative mg/dL   Spec Grav, UA 0.932 6.712 - 1.025   Blood, UA negative negative   pH, UA 6.0 5.0 - 8.0   Protein Ur, POC negative negative mg/dL   Urobilinogen, UA 0.2 0.2 or 1.0 E.U./dL   Nitrite, UA Negative Negative   Leukocytes, UA Negative Negative     ASSESSMENT & PLAN: Carlos Lambert was seen today for office visit.  Diagnoses and all orders for this visit:  Urinary frequency Comments: Much improved Orders: -     Urine Culture -     POCT urinalysis dipstick  Patient Instructions  Urinary Frequency, Adult Urinary frequency means urinating more often than usual. You may urinate every 1-2 hours even though you drink a normal amount of fluid and do not have a bladder infection or condition. Although you urinate more often than normal,the total amount of urine produced in a day is normal. With urinary frequency, you may have an urgent need to urinate often. The stress and anxiety of needing to find a bathroom quickly can make this urge worse. This condition may go away on its own or you may need treatment at home. Home treatment may include bladder training, exercises, taking medicines, ormaking changes to your diet. Follow these instructions at home: Bladder health  Keep a bladder diary if told by your health care provider. Keep track of: What you eat and drink. How  often you urinate. How much you urinate. Follow a bladder training program if told by your health care provider. This may include: Learning to delay going to the bathroom. Double urinating (voiding). This helps if you are not completely emptying your bladder. Scheduled voiding. Do Kegel exercises as told by your health care provider. Kegel exercises strengthen the muscles that help control urination, which may help the condition.  Eating and drinking If told by your health care provider, make diet changes, such as: Avoiding caffeine. Drinking  fewer fluids, especially alcohol. Not drinking in the evening. Avoiding foods or drinks that may irritate the bladder. These include coffee, tea, soda, artificial sweeteners, citrus, tomato-based foods, and chocolate. Eating foods that help prevent or ease constipation. Constipation can make this condition worse. Your health care provider may recommend that you: Drink enough fluid to keep your urine pale yellow. Take over-the-counter or prescription medicines. Eat foods that are high in fiber, such as beans, whole grains, and fresh fruits and vegetables. Limit foods that are high in fat and processed sugars, such as fried or sweet foods. General instructions Take over-the-counter and prescription medicines only as told by your health care provider. Keep all follow-up visits as told by your health care provider. This is important. Contact a health care provider if: You start urinating more often. You feel pain or irritation when you urinate. You notice blood in your urine. Your urine looks cloudy. You develop a fever. You begin vomiting. Get help right away if: You are unable to urinate. Summary Urinary frequency means urinating more often than usual. With urinary frequency, you may urinate every 1-2 hours even though you drink a normal amount of fluid and do not have a bladder infection or other bladder condition. Your health care provider may  recommend that you keep a bladder diary, follow a bladder training program, or make dietary changes. If told by your health care provider, do Kegel exercises to strengthen the muscles that help control urination. Take over-the-counter and prescription medicines only as told by your health care provider. Contact a health care provider if your symptoms do not improve or get worse. This information is not intended to replace advice given to you by your health care provider. Make sure you discuss any questions you have with your healthcare provider. Document Revised: 04/07/2018 Document Reviewed: 04/07/2018 Elsevier Patient Education  2021 Elsevier Inc.    Edwina Barth, MD [facility]

## 2021-05-13 NOTE — Patient Instructions (Signed)
Urinary Frequency, Adult Urinary frequency means urinating more often than usual. You may urinate every 1-2 hours even though you drink a normal amount of fluid and do not have a bladder infection or condition. Although you urinate more often than normal,the total amount of urine produced in a day is normal. With urinary frequency, you may have an urgent need to urinate often. The stress and anxiety of needing to find a bathroom quickly can make this urge worse. This condition may go away on its own or you may need treatment at home. Home treatment may include bladder training, exercises, taking medicines, ormaking changes to your diet. Follow these instructions at home: Bladder health  Keep a bladder diary if told by your health care provider. Keep track of: What you eat and drink. How often you urinate. How much you urinate. Follow a bladder training program if told by your health care provider. This may include: Learning to delay going to the bathroom. Double urinating (voiding). This helps if you are not completely emptying your bladder. Scheduled voiding. Do Kegel exercises as told by your health care provider. Kegel exercises strengthen the muscles that help control urination, which may help the condition.  Eating and drinking If told by your health care provider, make diet changes, such as: Avoiding caffeine. Drinking fewer fluids, especially alcohol. Not drinking in the evening. Avoiding foods or drinks that may irritate the bladder. These include coffee, tea, soda, artificial sweeteners, citrus, tomato-based foods, and chocolate. Eating foods that help prevent or ease constipation. Constipation can make this condition worse. Your health care provider may recommend that you: Drink enough fluid to keep your urine pale yellow. Take over-the-counter or prescription medicines. Eat foods that are high in fiber, such as beans, whole grains, and fresh fruits and vegetables. Limit foods  that are high in fat and processed sugars, such as fried or sweet foods. General instructions Take over-the-counter and prescription medicines only as told by your health care provider. Keep all follow-up visits as told by your health care provider. This is important. Contact a health care provider if: You start urinating more often. You feel pain or irritation when you urinate. You notice blood in your urine. Your urine looks cloudy. You develop a fever. You begin vomiting. Get help right away if: You are unable to urinate. Summary Urinary frequency means urinating more often than usual. With urinary frequency, you may urinate every 1-2 hours even though you drink a normal amount of fluid and do not have a bladder infection or other bladder condition. Your health care provider may recommend that you keep a bladder diary, follow a bladder training program, or make dietary changes. If told by your health care provider, do Kegel exercises to strengthen the muscles that help control urination. Take over-the-counter and prescription medicines only as told by your health care provider. Contact a health care provider if your symptoms do not improve or get worse. This information is not intended to replace advice given to you by your health care provider. Make sure you discuss any questions you have with your healthcare provider. Document Revised: 04/07/2018 Document Reviewed: 04/07/2018 Elsevier Patient Education  2021 Elsevier Inc.  

## 2021-05-14 LAB — URINE CULTURE: Result:: NO GROWTH

## 2021-05-23 DIAGNOSIS — L928 Other granulomatous disorders of the skin and subcutaneous tissue: Secondary | ICD-10-CM | POA: Diagnosis not present

## 2021-05-23 DIAGNOSIS — L929 Granulomatous disorder of the skin and subcutaneous tissue, unspecified: Secondary | ICD-10-CM | POA: Diagnosis not present

## 2021-05-23 DIAGNOSIS — I96 Gangrene, not elsewhere classified: Secondary | ICD-10-CM | POA: Diagnosis not present

## 2021-06-09 ENCOUNTER — Encounter: Payer: Self-pay | Admitting: Surgery

## 2021-08-30 IMAGING — US US EXTREM LOW*L* LIMITED
1 series · 14 of 17 positions shown · non-contrast
Comparison: None.

CLINICAL DATA: Left thigh mass for 2 months

EXAM:
ULTRASOUND LEFT LOWER EXTREMITY LIMITED
TECHNIQUE: Ultrasound examination of the lower extremity soft tissues was
performed in the area of clinical concern.

[Series 1: us extrem low*left* limited · 0.06mm/px · 17 acquisitions, 14 frames shown]
[im 1/17]
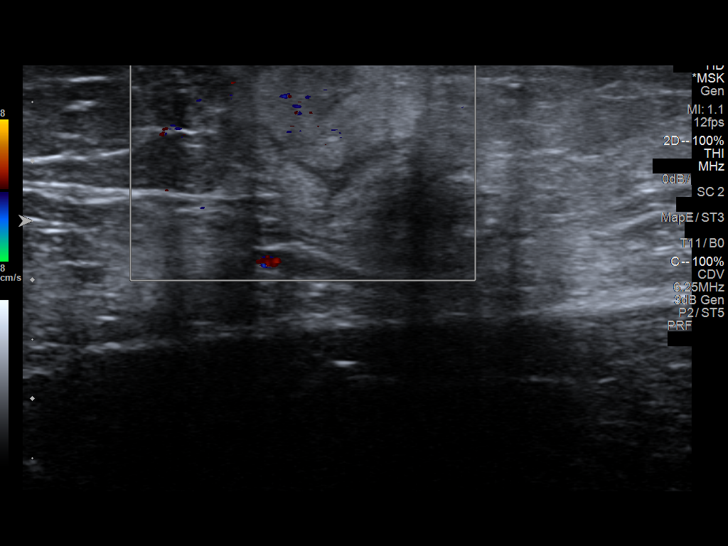
[im 2/17]
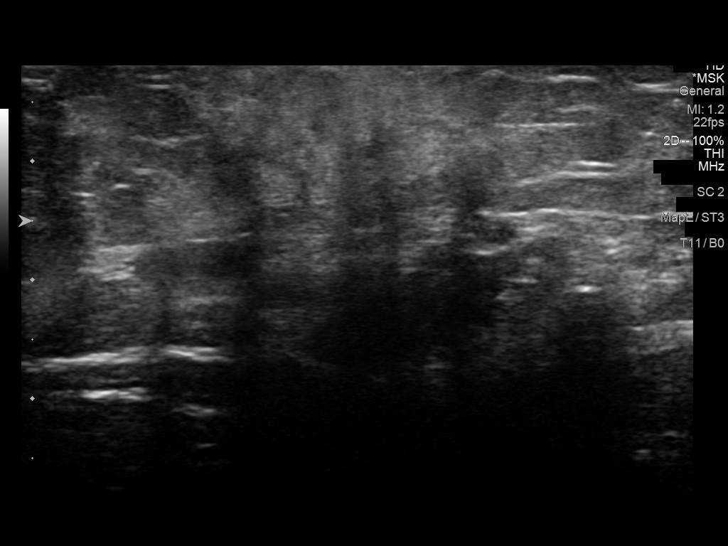
[im 4/17]
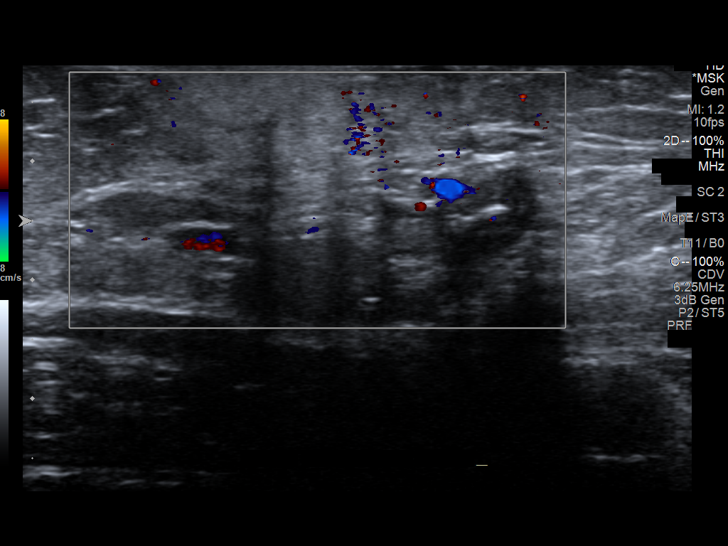
[im 5/17]
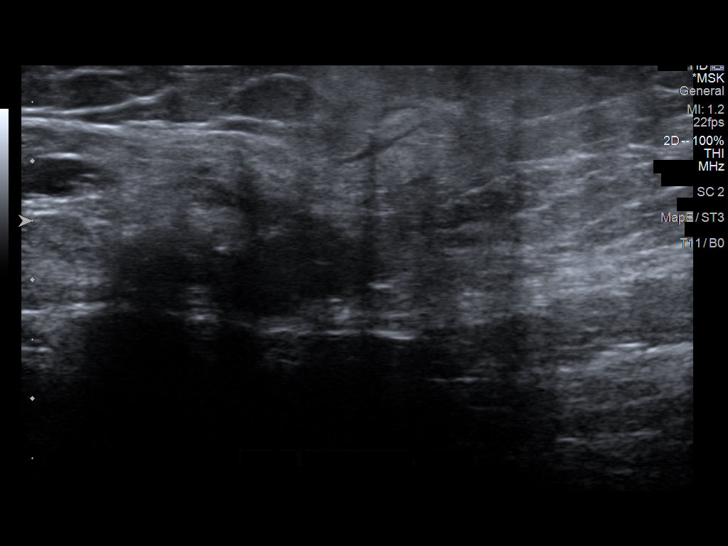
[im 6/17]
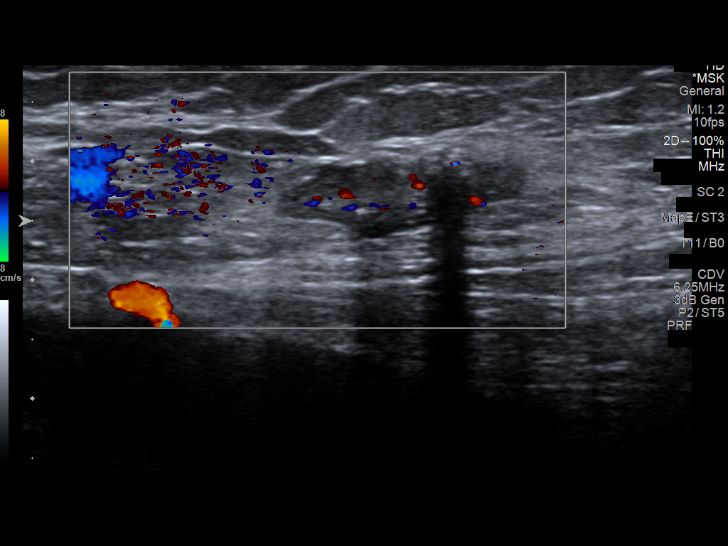
[im 7/17]
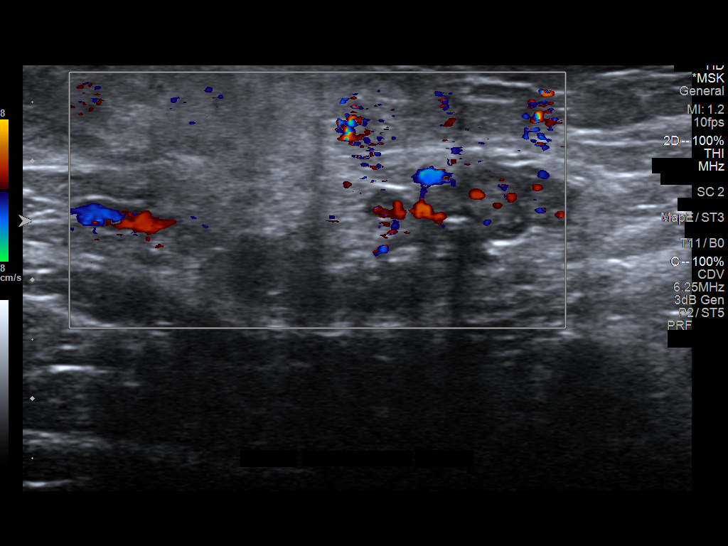
[im 8/17]
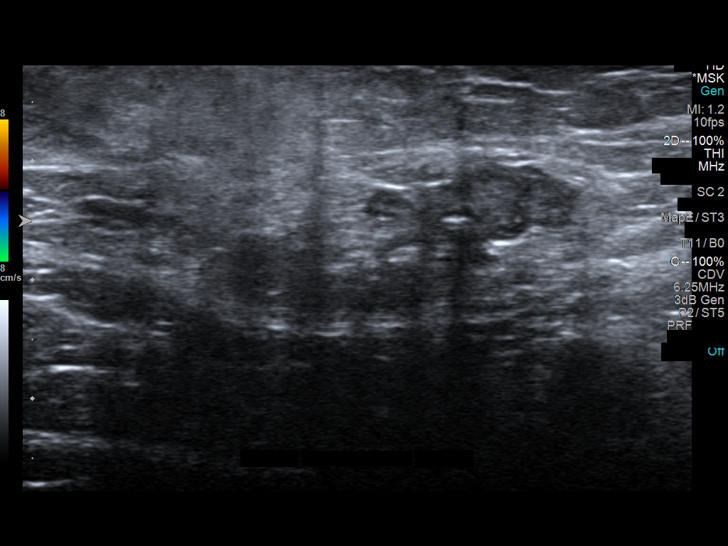
[im 10/17]
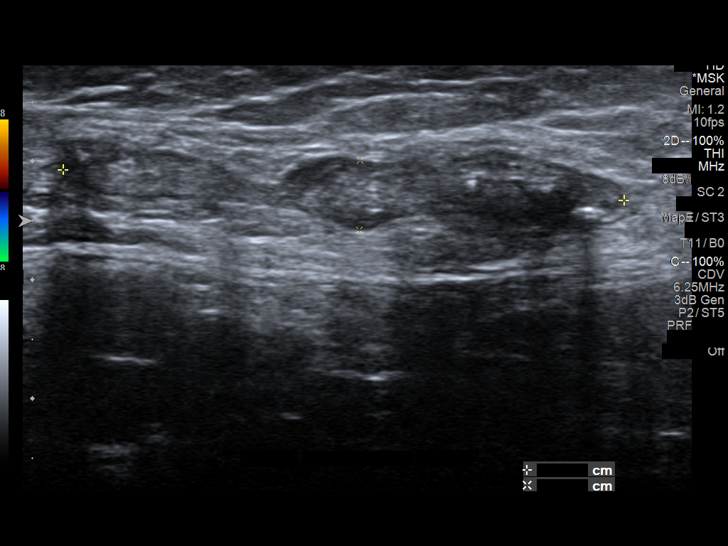
[im 11/17]
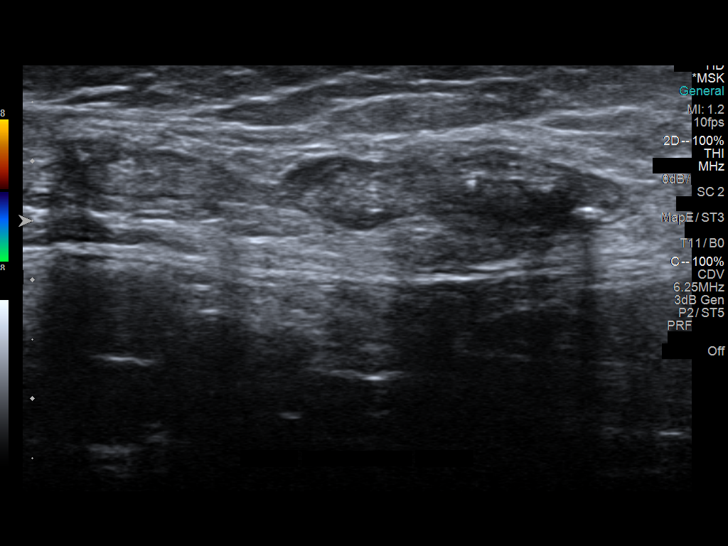
[im 12/17]
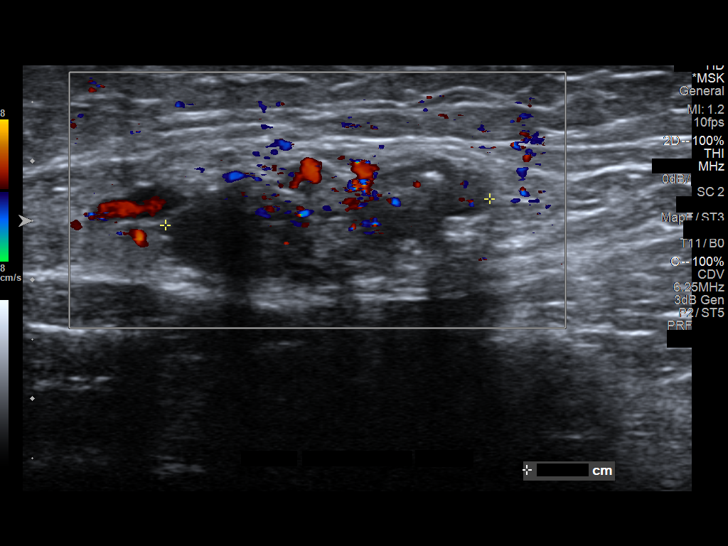
[im 13/17]
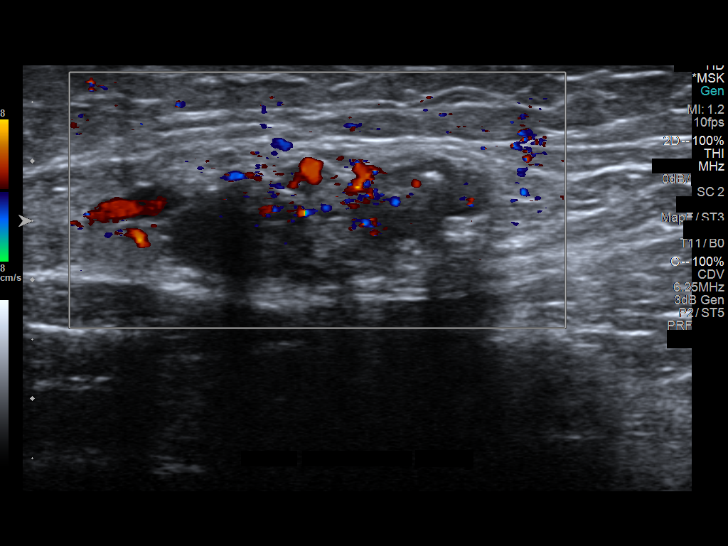
[im 14/17]
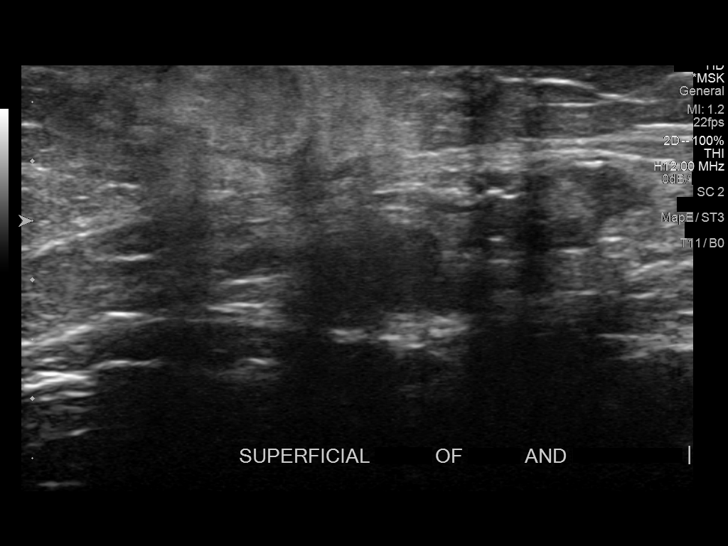
[im 16/17]
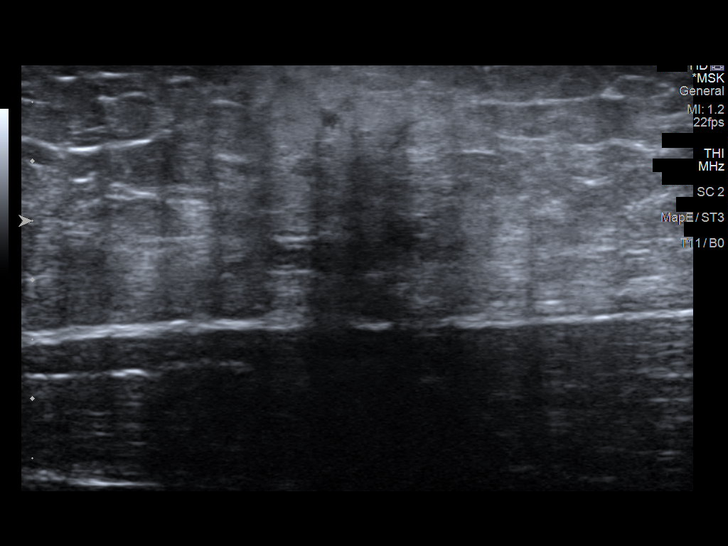
[im 17/17]
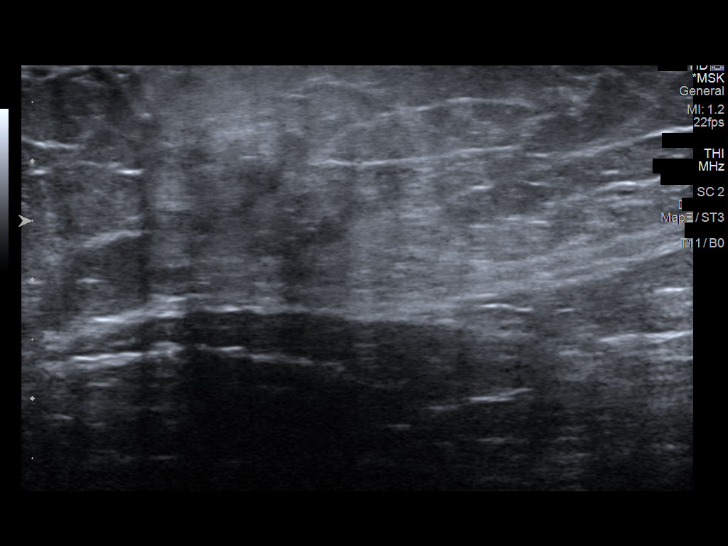

[14 of 17 positions shown; findings below may reference images not displayed]

FINDINGS: 4.7 x 0.6 x 2.7 cm heterogeneously hypoechoic masslike area within
the subcutaneous fat of the left upper inner thigh with small focal
hyperechoic foci within the mass. The hyperechoic foci are most
consistent with calcifications. There is internal Doppler flow.

No other solid or cystic mass.
IMPRESSION: Heterogeneously hypoechoic masslike area within the subcutaneous fat
of the left upper inner thigh measuring 4.7 x 0.6 x 2.7 cm with
small areas of calcification and internal Doppler flow. The area is
of indeterminate etiology but may reflect an area of fat necrosis.
Recommend further characterization with an MRI of the left thigh
without and with intravenous contrast.

## 2022-03-31 ENCOUNTER — Ambulatory Visit (INDEPENDENT_AMBULATORY_CARE_PROVIDER_SITE_OTHER): Payer: BC Managed Care – PPO | Admitting: Internal Medicine

## 2022-03-31 ENCOUNTER — Encounter: Payer: Self-pay | Admitting: Internal Medicine

## 2022-03-31 VITALS — BP 110/72 | HR 67 | Temp 98.7°F | Ht 69.0 in | Wt 223.0 lb

## 2022-03-31 DIAGNOSIS — K219 Gastro-esophageal reflux disease without esophagitis: Secondary | ICD-10-CM | POA: Diagnosis not present

## 2022-03-31 DIAGNOSIS — Z0001 Encounter for general adult medical examination with abnormal findings: Secondary | ICD-10-CM | POA: Diagnosis not present

## 2022-03-31 DIAGNOSIS — E538 Deficiency of other specified B group vitamins: Secondary | ICD-10-CM | POA: Diagnosis not present

## 2022-03-31 DIAGNOSIS — R1013 Epigastric pain: Secondary | ICD-10-CM

## 2022-03-31 DIAGNOSIS — Z1159 Encounter for screening for other viral diseases: Secondary | ICD-10-CM

## 2022-03-31 DIAGNOSIS — E559 Vitamin D deficiency, unspecified: Secondary | ICD-10-CM | POA: Diagnosis not present

## 2022-03-31 DIAGNOSIS — E785 Hyperlipidemia, unspecified: Secondary | ICD-10-CM

## 2022-03-31 DIAGNOSIS — Z136 Encounter for screening for cardiovascular disorders: Secondary | ICD-10-CM | POA: Diagnosis not present

## 2022-03-31 DIAGNOSIS — R42 Dizziness and giddiness: Secondary | ICD-10-CM

## 2022-03-31 DIAGNOSIS — R7303 Prediabetes: Secondary | ICD-10-CM | POA: Diagnosis not present

## 2022-03-31 LAB — LDL CHOLESTEROL, DIRECT: Direct LDL: 164 mg/dL

## 2022-03-31 LAB — CBC WITH DIFFERENTIAL/PLATELET
Basophils Absolute: 0 10*3/uL (ref 0.0–0.1)
Basophils Relative: 0.5 % (ref 0.0–3.0)
Eosinophils Absolute: 0.1 10*3/uL (ref 0.0–0.7)
Eosinophils Relative: 1 % (ref 0.0–5.0)
HCT: 41 % (ref 39.0–52.0)
Hemoglobin: 14 g/dL (ref 13.0–17.0)
Lymphocytes Relative: 26.1 % (ref 12.0–46.0)
Lymphs Abs: 1.8 10*3/uL (ref 0.7–4.0)
MCHC: 34.1 g/dL (ref 30.0–36.0)
MCV: 86.9 fl (ref 78.0–100.0)
Monocytes Absolute: 0.5 10*3/uL (ref 0.1–1.0)
Monocytes Relative: 7.2 % (ref 3.0–12.0)
Neutro Abs: 4.4 10*3/uL (ref 1.4–7.7)
Neutrophils Relative %: 65.2 % (ref 43.0–77.0)
Platelets: 240 10*3/uL (ref 150.0–400.0)
RBC: 4.72 Mil/uL (ref 4.22–5.81)
RDW: 13.3 % (ref 11.5–15.5)
WBC: 6.7 10*3/uL (ref 4.0–10.5)

## 2022-03-31 LAB — URINALYSIS, ROUTINE W REFLEX MICROSCOPIC
Bilirubin Urine: NEGATIVE
Hgb urine dipstick: NEGATIVE
Ketones, ur: NEGATIVE
Leukocytes,Ua: NEGATIVE
Nitrite: NEGATIVE
Specific Gravity, Urine: 1.025 (ref 1.000–1.030)
Total Protein, Urine: NEGATIVE
Urine Glucose: NEGATIVE
Urobilinogen, UA: 0.2 (ref 0.0–1.0)
pH: 6 (ref 5.0–8.0)

## 2022-03-31 LAB — HEPATIC FUNCTION PANEL
ALT: 36 U/L (ref 0–53)
AST: 23 U/L (ref 0–37)
Albumin: 4.5 g/dL (ref 3.5–5.2)
Alkaline Phosphatase: 56 U/L (ref 39–117)
Bilirubin, Direct: 0.1 mg/dL (ref 0.0–0.3)
Total Bilirubin: 0.6 mg/dL (ref 0.2–1.2)
Total Protein: 7.3 g/dL (ref 6.0–8.3)

## 2022-03-31 LAB — LIPID PANEL
Cholesterol: 249 mg/dL — ABNORMAL HIGH (ref 0–200)
HDL: 33.6 mg/dL — ABNORMAL LOW (ref >39.00)
NonHDL: 215.05
Total CHOL/HDL Ratio: 7
Triglycerides: 219 mg/dL — ABNORMAL HIGH (ref 0.0–149.0)
VLDL: 43.8 mg/dL — ABNORMAL HIGH (ref 0.0–40.0)

## 2022-03-31 LAB — BASIC METABOLIC PANEL
BUN: 13 mg/dL (ref 6–23)
CO2: 29 mEq/L (ref 19–32)
Calcium: 9.4 mg/dL (ref 8.4–10.5)
Chloride: 102 mEq/L (ref 96–112)
Creatinine, Ser: 0.99 mg/dL (ref 0.40–1.50)
GFR: 96.11 mL/min (ref >60.00)
Glucose, Bld: 91 mg/dL (ref 70–99)
Potassium: 4.1 mEq/L (ref 3.5–5.1)
Sodium: 139 mEq/L (ref 135–145)

## 2022-03-31 LAB — TSH: TSH: 1.41 u[IU]/mL (ref 0.35–5.50)

## 2022-03-31 LAB — VITAMIN B12: Vitamin B-12: 609 pg/mL (ref 211–911)

## 2022-03-31 LAB — VITAMIN D 25 HYDROXY (VIT D DEFICIENCY, FRACTURES): VITD: 22.46 ng/mL — ABNORMAL LOW (ref 30.00–100.00)

## 2022-03-31 LAB — HEMOGLOBIN A1C: Hgb A1c MFr Bld: 5.9 % (ref 4.6–6.5)

## 2022-03-31 MED ORDER — ROSUVASTATIN CALCIUM 20 MG PO TABS: 20.0000 mg | ORAL_TABLET | Freq: Every day | ORAL | 3 refills | Status: DC

## 2022-03-31 MED ORDER — PANTOPRAZOLE SODIUM 40 MG PO TBEC: 40.0000 mg | DELAYED_RELEASE_TABLET | Freq: Every day | ORAL | 3 refills | Status: DC

## 2022-03-31 NOTE — Assessment & Plan Note (Addendum)
With episode off balance for 1 second standing in kitchen last wk - none since but scared him and made him concerned as he is a single parent, today for lab eval as ordered,  to f/u any worsening symptoms or concerns

## 2022-03-31 NOTE — Progress Notes (Unsigned)
Patient ID: Carlos Lambert, male   DOB: 1981/04/24, 41 y.o.   MRN: [id number]         Chief Complaint:: wellness exam and Office Visit (dizziness)  As PCP not here today, hyperglycemia, hld, gerd       HPI:  Carlos Lambert is a 41 y.o. male here for wellness exam; due for hep c screen, o/w up to date                        Also Pt denies chest pain, increased sob or doe, wheezing, orthopnea, PND, increased LE swelling, palpitations, dizziness or syncope, except for 1 second of off balance in the kitchen a few days ago that really bothered him, as he a single parent and working very hard mentally and physically at work.   Pt denies polydipsia, polyuria, or new focal neuro s/s.    Pt denies fever, wt loss, night sweats, loss of appetite, or other constitutional symptoms  Very much would like to update his labs today.  Also gained close to 20 lbs with less active and work demands, but has treadmill in the home and plans to restart this.  Has also had mild worsening reflux and mild epigastric abd pain, but no dysphagia, n/v, bowel change or blood.  Would like to restart PPI.  Also asking to restart crestor as well. Admits to not working on diet as much as he could ideally recently.  Does have several wks ongoing nasal allergy symptoms with clearish congestion, itch and sneezing, but overall very mild, without fever, pain, ST, cough, swelling or wheezing.  Wt Readings from Last 3 Encounters:  03/31/22 223 lb (101.2 kg)  05/13/21 215 lb (97.5 kg)  05/10/21 205 lb (93 kg)   BP Readings from Last 3 Encounters:  03/31/22 110/72  05/13/21 114/66  05/10/21 121/79   There is no immunization history on file for this patient. Health Maintenance Due  Topic Date Due   Hepatitis C Screening  Never done      History reviewed. No pertinent past medical history. Past Surgical History:  Procedure Laterality Date   KNEE SURGERY      reports that he has quit smoking. He has never used smokeless tobacco. He  reports current alcohol use. He reports that he does not use drugs. family history includes Cancer in his mother; Hyperlipidemia in his father; Hypertension in his father. No Known Allergies Current Outpatient Medications on File Prior to Visit  Medication Sig Dispense Refill   Calcium Carbonate-Vit D-Min (CALCIUM 1200 PO) Take by mouth. (Patient not taking: Reported on 03/31/2022)     No current facility-administered medications on file prior to visit.        ROS:  All others reviewed and negative.  Objective        PE:  BP 110/72 (BP Location: Left Arm, Patient Position: Sitting, Cuff Size: Large)   Pulse 67   Temp 98.7 F (37.1 C) (Oral)   Ht 5\' 9"  (1.753 m)   Wt 223 lb (101.2 kg)   SpO2 98%   BMI 32.93 kg/m                 Constitutional: Pt appears in NAD               HENT: Head: NCAT.                Right Ear: External ear normal.  Left Ear: External ear normal.                Eyes: . Pupils are equal, round, and reactive to light. Conjunctivae and EOM are normal               Nose: without d/c or deformity               Neck: Neck supple. Gross normal ROM               Cardiovascular: Normal rate and regular rhythm.                 Pulmonary/Chest: Effort normal and breath sounds without rales or wheezing.                Abd:  Soft, NT, ND, + BS, no organomegaly               Neurological: Pt is alert. At baseline orientation, motor grossly intact               Skin: Skin is warm. No rashes, no other new lesions, LE edema - none               Psychiatric: Pt behavior is normal without agitation   Micro: none  Cardiac tracings I have personally interpreted today:  none  Pertinent Radiological findings (summarize): none   Lab Results  Component Value Date   WBC 7.1 05/28/2020   HGB 14.2 05/28/2020   HCT 40.6 05/28/2020   PLT 236 05/28/2020   GLUCOSE 85 09/09/2020   CHOL 147 09/09/2020   TRIG 180 (H) 09/09/2020   HDL 38 (L) 09/09/2020   LDLCALC  78 09/09/2020   ALT 39 09/09/2020   AST 21 09/09/2020   NA 142 09/09/2020   K 4.3 09/09/2020   CL 105 09/09/2020   CREATININE 1.00 09/09/2020   BUN 18 09/09/2020   CO2 25 09/09/2020   TSH 1.440 04/26/2017   PSA 0.91 12/18/2013   HGBA1C 5.9 (H) 09/09/2020   Assessment/Plan:  Carlos Lambert is a 41 y.o. White or Caucasian [1] male with  has no past medical history on file.  Dizziness With episode off balance for 1 second standing in kitchen last wk - none since, for lab eval,  to f/u any worsening symptoms or concerns Followup: No follow-ups on file.  Oliver Barre, MD 03/31/2022 2:17 PM [facility]

## 2022-03-31 NOTE — Patient Instructions (Signed)
Please continue all other medications as before, including to restart the antacid and cholesterol medications  Please have the pharmacy call with any other refills you may need.  Please continue your efforts at being more active, low cholesterol diet, and weight control.  You are otherwise up to date with prevention measures today.  Please keep your appointments with your specialists as you may have planned  Please go to the LAB at the blood drawing area for the tests to be done  You will be contacted by phone if any changes need to be made immediately.  Otherwise, you will receive a letter about your results with an explanation, but please check with MyChart first.  Please remember to sign up for MyChart if you have not done so, as this will be important to you in the future with finding out test results, communicating by private email, and scheduling acute appointments online when needed.  Please make an Appointment to return for your 1 year visit, or sooner if needed, to Dr Alvy Bimler

## 2022-04-01 LAB — HEPATITIS C ANTIBODY: Hepatitis C Ab: NONREACTIVE

## 2022-04-04 ENCOUNTER — Encounter: Payer: Self-pay | Admitting: Internal Medicine

## 2022-04-04 DIAGNOSIS — E559 Vitamin D deficiency, unspecified: Secondary | ICD-10-CM | POA: Insufficient documentation

## 2022-04-04 NOTE — Assessment & Plan Note (Signed)
Last vitamin D Lab Results  Component Value Date   VD25OH 22.46 (L) 03/31/2022   Low, to start oral replacement

## 2022-05-14 ENCOUNTER — Encounter: Payer: Self-pay | Admitting: Family Medicine

## 2022-05-14 ENCOUNTER — Ambulatory Visit (INDEPENDENT_AMBULATORY_CARE_PROVIDER_SITE_OTHER): Payer: BC Managed Care – PPO | Admitting: Family Medicine

## 2022-05-14 ENCOUNTER — Ambulatory Visit (HOSPITAL_COMMUNITY)
Admission: RE | Admit: 2022-05-14 | Discharge: 2022-05-14 | Disposition: A | Discharge status: Home / Self Care | Payer: BC Managed Care – PPO | Source: Ambulatory Visit | Attending: Cardiovascular Disease | Admitting: Cardiovascular Disease

## 2022-05-14 VITALS — BP 132/78 | HR 68 | Temp 97.6°F | Ht 69.0 in | Wt 227.0 lb

## 2022-05-14 DIAGNOSIS — M7989 Other specified soft tissue disorders: Secondary | ICD-10-CM | POA: Insufficient documentation

## 2022-05-14 DIAGNOSIS — M79662 Pain in left lower leg: Secondary | ICD-10-CM | POA: Insufficient documentation

## 2022-05-14 NOTE — Progress Notes (Signed)
Subjective:     Patient ID: Antonius Hartlage, male    DOB: 1981-03-21, 41 y.o.   MRN: [id number]  Chief Complaint  Patient presents with   Joint Swelling    Left ankle swelling for about 2 days now. Denies any injury, states it came out of the blue.    HPI Patient is in today for a 2 day hx of left lower leg swelling and tenderness. Denies injury. No pain with walking.   No hx of DVT but is quite concerned that he could have a blood clot and requests to be tested. No recent surgery. No immobilization. No recent air travel or long car rides.   Denies fever, chills, dizziness, chest pain, palpitations, shortness of breath, abdominal pain, N/V/D.    Health Maintenance Due  Topic Date Due   COVID-19 Vaccine (1) Never done   INFLUENZA VACCINE  05/12/2022    History reviewed. No pertinent past medical history.  Past Surgical History:  Procedure Laterality Date   KNEE SURGERY      Family History  Problem Relation Age of Onset   Hypertension Father    Hyperlipidemia Father    Cancer Mother        LUNG    Social History   Socioeconomic History   Marital status: Divorced    Spouse name: Not on file   Number of children: 1   Years of education: Not on file   Highest education level: Not on file  Occupational History   Not on file  Tobacco Use   Smoking status: Former   Smokeless tobacco: Never  Vaping Use   Vaping Use: Never used  Substance and Sexual Activity   Alcohol use: Yes   Drug use: No   Sexual activity: Not on file  Other Topics Concern   Not on file  Social History Narrative    Family originally from [place] and ancestors from [place]   Social Determinants of Health   Financial Resource Strain: Not on file  Food Insecurity: Not on file  Transportation Needs: Not on file  Physical Activity: Not on file  Stress: Not on file  Social Connections: Not on file  Intimate Partner Violence: Not on file    Outpatient Medications Prior to Visit   Medication Sig Dispense Refill   pantoprazole (PROTONIX) 40 MG tablet Take 1 tablet (40 mg total) by mouth daily. 90 tablet 3   rosuvastatin (CRESTOR) 20 MG tablet Take 1 tablet (20 mg total) by mouth daily. 90 tablet 3   Calcium Carbonate-Vit D-Min (CALCIUM 1200 PO) Take by mouth. (Patient not taking: Reported on 03/31/2022)     No facility-administered medications prior to visit.    No Known Allergies  ROS     Objective:    Physical Exam Constitutional:      General: He is not in acute distress.    Appearance: He is not ill-appearing.  Cardiovascular:     Rate and Rhythm: Normal rate and regular rhythm.  Pulmonary:     Effort: Pulmonary effort is normal.     Breath sounds: Normal breath sounds.  Musculoskeletal:     Left lower leg: Swelling and tenderness present.     Left ankle: Swelling present. No tenderness. Normal range of motion. Normal pulse.     Comments: Mild non pitting edema of left lower leg including lateral ankle, left calf TTP. LLE is neurovascularly intact.   Skin:    General: Skin is warm and dry.  Capillary Refill: Capillary refill takes less than 2 seconds.  Neurological:     General: No focal deficit present.     Mental Status: He is alert and oriented to person, place, and time.     Sensory: No sensory deficit.     Motor: No weakness.     Gait: Gait normal.  Psychiatric:        Mood and Affect: Mood normal.        Behavior: Behavior normal.     BP 132/78 (BP Location: Left Arm, Patient Position: Sitting, Cuff Size: Large)   Pulse 68   Temp 97.6 F (36.4 C) (Temporal)   Ht 5\' 9"  (1.753 m)   Wt 227 lb (103 kg)   SpO2 98%   BMI 33.52 kg/m  Wt Readings from Last 3 Encounters:  05/14/22 227 lb (103 kg)  03/31/22 223 lb (101.2 kg)  05/13/21 215 lb (97.5 kg)       Assessment & Plan:   Problem List Items Addressed This Visit       Other   Pain and swelling of left lower leg - Primary    Vascular 07/13/21 ordered to rule out DVT of LLE.  Follow up pending results. Discussed referral to ortho or sports medicine if study is negative.       Relevant Orders   VAS [place] LOWER EXTREMITY VENOUS (DVT) (Completed)    I am having Reinhard Longstreth maintain his Calcium Carbonate-Vit D-Min (CALCIUM 1200 PO), pantoprazole, and rosuvastatin.  No orders of the defined types were placed in this encounter.

## 2022-05-14 NOTE — Patient Instructions (Addendum)
An order has been placed for an ultrasound of your left lower leg to rule out a DVT (blood clot).   You will receive a call to schedule this.   We will be in touch with your results.

## 2022-05-14 NOTE — Progress Notes (Signed)
Please let him know that the ultrasound does not show a blood clot or any issue with his leg to explain the swelling.  If he continues having an issue then we can refer him to sports medicine or an orthopedist.

## 2022-05-14 NOTE — Assessment & Plan Note (Signed)
Vascular US ordered to rule out DVT of LLE. Follow up pending results. Discussed referral to ortho or sports medicine if study is negative.

## 2022-09-25 DIAGNOSIS — H10013 Acute follicular conjunctivitis, bilateral: Secondary | ICD-10-CM | POA: Diagnosis not present

## 2022-12-30 ENCOUNTER — Ambulatory Visit
Admission: RE | Admit: 2022-12-30 | Discharge: 2022-12-30 | Disposition: A | Discharge status: Home / Self Care | Payer: BC Managed Care – PPO | Source: Ambulatory Visit | Attending: Family Medicine | Admitting: Family Medicine

## 2022-12-30 VITALS — BP 112/75 | HR 59 | Temp 97.7°F | Resp 16 | Ht 69.0 in | Wt 229.0 lb

## 2022-12-30 DIAGNOSIS — F439 Reaction to severe stress, unspecified: Secondary | ICD-10-CM | POA: Diagnosis not present

## 2022-12-30 DIAGNOSIS — R0789 Other chest pain: Secondary | ICD-10-CM | POA: Diagnosis not present

## 2022-12-30 HISTORY — DX: Pain in leg, unspecified: M79.606

## 2022-12-30 HISTORY — DX: Pure hypercholesterolemia, unspecified: E78.00

## 2022-12-30 HISTORY — DX: Gastro-esophageal reflux disease without esophagitis: K21.9

## 2022-12-30 HISTORY — DX: Chest pain, unspecified: R07.9

## 2022-12-30 NOTE — Progress Notes (Unsigned)
Cardiology Office Note:    Date:  12/31/2022   ID:  Carlos Lambert, DOB September 19, 1981, MRN [id number]  PCP:  Horald Pollen, [place] [facility] Cardiologist:  Lenna Sciara, MD Referring MD: Horald Pollen, *   Chief Complaint/Reason for Referral:  Chest pain  ASSESSMENT:    1. Precordial pain   2. Dyslipidemia     PLAN:    In order of problems listed above:  Chest pain:  We will obtain a coronary CTA and echocardiogram to evaluate further.  If the patient has mild obstructive coronary artery disease, they will require a statin (with goal LDL < 70) and aspirin, if they have high-grade disease we will need to consider optimal medical therapy and if symptoms are refractory to medical therapy, then a cardiac catheterization with possible PCI will be pursued to alleviate symptoms.  If they have high risk disease we will proceed directly to cardiac catheterization.   Dyslipidemia:  Managed by PCP.             Dispo:  Return if symptoms worsen or fail to improve.      Medication Adjustments/Labs and Tests Ordered: Current medicines are reviewed at length with the patient today.  Concerns regarding medicines are outlined above.  The following changes have been made:     Labs/tests ordered: Orders Placed This Encounter  Procedures   CT CORONARY MORPH W/CTA COR W/SCORE W/CA W/CM &/OR WO/CM   Basic metabolic panel   EKG [id number]   ECHOCARDIOGRAM COMPLETE    Medication Changes: Meds ordered this encounter  Medications   metoprolol tartrate (LOPRESSOR) 25 MG tablet    Sig: Take 1 tablet (25 mg total) by mouth once for 1 dose.    Dispense:  1 tablet    Refill:  0     Current medicines are reviewed at length with the patient today.  The patient does not have concerns regarding medicines.   History of Present Illness:    FOCUSED PROBLEM LIST:   1.  BMI 33 2.  Hyperlipidemia  The patient is a 42 y.o. male with the indicated medical history here  for recommendations regarding new onset chest pain.  The patient is under a lot of stress.  This is both from work and at home.  He tells me at times he develops the chest pain.  This can happen at rest and with exertion at times.  He radiates to his left upper chest from the central region.  It does not radiate down his arm.  He denies any significant shortness of breath.  He used to smoke but he quit some years ago.  He smoked for maybe 7 years.  He has had issues with left lower extremity edema and has had a little lower extremity ultrasound which has been negative.  This edema seems to come and go.  He is on his feet a lot at work he is not had any severe bleeding or bruising episodes.  He is just started taking his cholesterol medication may be a week ago.          Current Medications: Current Meds  Medication Sig   metoprolol tartrate (LOPRESSOR) 25 MG tablet Take 1 tablet (25 mg total) by mouth once for 1 dose.   pantoprazole (PROTONIX) 40 MG tablet Take 1 tablet (40 mg total) by mouth daily.   rosuvastatin (CRESTOR) 20 MG tablet Take 1 tablet (20 mg total) by mouth daily.     Allergies:  Patient has no known allergies.   Social History:   Social History   Tobacco Use   Smoking status: Former   Smokeless tobacco: Never  Scientific laboratory technician Use: Never used  Substance Use Topics   Alcohol use: Not Currently   Drug use: No     Family Hx: Family History  Problem Relation Age of Onset   Hypertension Father    Hyperlipidemia Father    Cancer Mother        LUNG     Review of Systems:   Please see the history of present illness.    All other systems reviewed and are negative.     EKGs/Labs/Other Test Reviewed:    EKG:  EKG performed March 2024 that I personally reviewed demonstrates sinus bradycardia; EKG done today demonstrates normal sinus rhythm  Prior CV studies: Lower extremity Dopplers 2023 negative      Other studies Reviewed: Review of the additional  studies/records demonstrates: Imaging studies do not demonstrate aortic atherosclerosis or coronary artery calcification  Recent Labs: 03/31/2022: ALT 36; BUN 13; Creatinine, Ser 0.99; Hemoglobin 14.0; Platelets 240.0; Potassium 4.1; Sodium 139; TSH 1.41   Recent Lipid Panel Lab Results  Component Value Date/Time   CHOL 249 (H) 03/31/2022 02:23 PM   CHOL 147 09/09/2020 04:20 PM   TRIG 219.0 (H) 03/31/2022 02:23 PM   HDL 33.60 (L) 03/31/2022 02:23 PM   HDL 38 (L) 09/09/2020 04:20 PM   LDLCALC 78 09/09/2020 04:20 PM   LDLDIRECT 164.0 03/31/2022 02:23 PM    Risk Assessment/Calculations:                Physical Exam:    VS:  BP 138/84   Pulse 60   Ht 5\' 9"  (1.753 m)   Wt 232 lb (105.2 kg)   SpO2 98%   BMI 34.26 kg/m    Wt Readings from Last 3 Encounters:  12/31/22 232 lb (105.2 kg)  12/30/22 229 lb (103.9 kg)  05/14/22 227 lb (103 kg)    GENERAL:  No apparent distress, AOx3 HEENT:  No carotid bruits, +2 carotid impulses, no scleral icterus CAR: RRR no murmurs, gallops, rubs, or thrills RES:  Clear to auscultation bilaterally ABD:  Soft, nontender, nondistended, positive bowel sounds x 4 VASC:  +2 radial pulses, +2 carotid pulses, palpable pedal pulses NEURO:  CN 2-12 grossly intact; motor and sensory grossly intact PSYCH:  No active depression or anxiety EXT:  No edema, ecchymosis, or cyanosis  Signed, Early Osmond, MD  12/31/2022 1:39 PM    [facility], [place], [place]  [address] Phone: [phone]; Fax: [phone]   Note:  This document was prepared using Dragon voice recognition software and may include unintentional dictation errors.

## 2022-12-30 NOTE — Discharge Instructions (Addendum)
The cardiology office will call you about a consultation  If your chest pain worsens, go to the ER

## 2022-12-30 NOTE — ED Triage Notes (Signed)
Pt presents to Urgent Care with c/o intermittent chest pain x "a few days." Reports pain radiates from sternal area to L chest and into shoulder. Denies sob and nausea.

## 2022-12-30 NOTE — ED Provider Notes (Signed)
[facility]    CSN: [id number] Arrival date & time: 12/30/22  0800      History   Chief Complaint Chief Complaint  Patient presents with   Chest Pain    HPI Carlos Lambert is a 42 y.o. male.   HPI 42 year old gentleman with hypercholesterolemia.  He is here for chest pain.  Is been going on for the last 2 to 3 days.  He states that it is in the center of his chest and goes up towards the left shoulder.  It is a pressure sensation.  It is random.  He states it happens more often when he is anxious or stressed.  He is able to exercise without difficulty.  He has no associated symptoms such as dizziness or nausea.  No shortness of breath. Patient has a history of GERD and is not on Protonix.  He states that he has had no GI symptoms Patient states that he does have high cholesterol and is compliant with his Crestor He has never had hypertension.  No history of heart disease.  No heart disease at a young age in his family history.  No alcohol or tobacco   Past Medical History:  Diagnosis Date   Hypercholesteremia     Patient Active Problem List   Diagnosis Date Noted   Pain and swelling of left lower leg 05/14/2022   Vitamin D deficiency 04/04/2022   Encounter for well adult exam with abnormal findings 03/31/2022   GERD (gastroesophageal reflux disease) 03/31/2022   Dizziness 03/31/2022   Soft tissue mass 03/11/2021   Dyslipidemia 03/05/2020   Prediabetes 03/05/2020   Elevated triglycerides with high cholesterol 04/02/2018   History of prediabetes 04/02/2018    Past Surgical History:  Procedure Laterality Date   KNEE SURGERY         Home Medications    Prior to Admission medications   Medication Sig Start Date End Date Taking? Authorizing Provider  pantoprazole (PROTONIX) 40 MG tablet Take 1 tablet (40 mg total) by mouth daily. 03/31/22   Biagio Borg, MD  rosuvastatin (CRESTOR) 20 MG tablet Take 1 tablet (20 mg total) by mouth daily. 03/31/22   Biagio Borg, MD    Family History Family History  Problem Relation Age of Onset   Hypertension Father    Hyperlipidemia Father    Cancer Mother        LUNG    Social History Social History   Tobacco Use   Smoking status: Former   Smokeless tobacco: Never  Scientific laboratory technician Use: Never used  Substance Use Topics   Alcohol use: Not Currently   Drug use: No     Allergies   Patient has no known allergies.   Review of Systems Review of Systems See HPI  Physical Exam Triage Vital Signs ED Triage Vitals  Enc Vitals Group     BP 12/30/22 0815 112/75     Pulse Rate 12/30/22 0815 (!) 59     Resp 12/30/22 0815 16     Temp 12/30/22 0815 97.7 F (36.5 C)     Temp src --      SpO2 12/30/22 0815 99 %     Weight 12/30/22 0814 229 lb (103.9 kg)     Height 12/30/22 0814 5\' 9"  (1.753 m)     Head Circumference --      Peak Flow --      Pain Score 12/30/22 0814 0     Pain Loc --  Pain Edu? --      Excl. in [place]? --    No data found.  Updated Vital Signs BP 112/75 (BP Location: Right Arm)   Pulse (!) 59   Temp 97.7 F (36.5 C)   Resp 16   Ht 5\' 9"  (1.753 m)   Wt 103.9 kg   SpO2 99%   BMI 33.82 kg/m       Physical Exam Constitutional:      General: He is not in acute distress.    Appearance: He is well-developed.     Comments: No acute distress.  Mild overweight  HENT:     Head: Normocephalic and atraumatic.  Eyes:     Conjunctiva/sclera: Conjunctivae normal.     Pupils: Pupils are equal, round, and reactive to light.  Cardiovascular:     Rate and Rhythm: Regular rhythm. Bradycardia present.     Heart sounds: Normal heart sounds.  Pulmonary:     Effort: Pulmonary effort is normal. No respiratory distress.     Breath sounds: Normal breath sounds.  Abdominal:     General: Bowel sounds are normal. There is no distension.     Palpations: Abdomen is soft. There is no hepatomegaly or splenomegaly.     Tenderness: There is no abdominal tenderness.   Musculoskeletal:        General: Normal range of motion.     Cervical back: Normal range of motion and neck supple.     Right lower leg: No edema.     Left lower leg: No edema.  Skin:    General: Skin is warm and dry.  Neurological:     Mental Status: He is alert.  Psychiatric:        Mood and Affect: Mood normal.        Behavior: Behavior normal.      UC Treatments / Results  Labs (all labs ordered are listed, but only abnormal results are displayed) Labs Reviewed - No data to display  EKG   Radiology No results found.  Procedures ED EKG  Date/Time: 12/30/2022 9:16 AM  Performed by: Raylene Everts, MD Authorized by: Raylene Everts, MD   ECG interpreted by ED Physician in the absence of a cardiologist: yes   Previous ECG:    Previous ECG:  Unavailable Interpretation:    Interpretation: normal     Details:  Minus bradycardia Rate:    ECG rate:  59   ECG rate assessment: bradycardic   Rhythm:    Rhythm: sinus rhythm   Ectopy:    Ectopy: none   QRS:    QRS axis:  Normal   QRS intervals:  Normal   QRS conduction: normal   ST segments:    ST segments:  Normal T waves:    T waves: normal   Q waves:    Abnormal Q-waves: not present    (including critical care time)  Medications Ordered in UC Medications - No data to display  Initial Impression / Assessment and Plan / UC Course  I have reviewed the triage vital signs and the nursing notes.  Pertinent labs & imaging results that were available during my care of the patient were reviewed by me and considered in my medical decision making (see chart for details).     Discussed with patient that a normal EKG is just a snapshot of his heart activity right now.  It is not a guarantee that he is not developing heart disease.  He certainly has  a history of stress, and the chest pain comes on with stress which may be due to anxiety.  I do believe he should have additional cardiac workup.  He agrees to  cardiology consultation Final Clinical Impressions(s) / UC Diagnoses   Final diagnoses:  Other chest pain  Emotional stress reaction     Discharge Instructions      The cardiology office will call you about a consultation  If your chest pain worsens, go to the ER   ED Prescriptions   None    PDMP not reviewed this encounter.   Raylene Everts, MD 12/30/22 [phone]

## 2022-12-31 ENCOUNTER — Telehealth: Payer: Self-pay

## 2022-12-31 ENCOUNTER — Ambulatory Visit: Payer: BC Managed Care – PPO | Attending: Internal Medicine | Admitting: Internal Medicine

## 2022-12-31 ENCOUNTER — Encounter: Payer: Self-pay | Admitting: Internal Medicine

## 2022-12-31 VITALS — BP 138/84 | HR 60 | Ht 69.0 in | Wt 232.0 lb

## 2022-12-31 DIAGNOSIS — R072 Precordial pain: Secondary | ICD-10-CM | POA: Diagnosis not present

## 2022-12-31 DIAGNOSIS — E785 Hyperlipidemia, unspecified: Secondary | ICD-10-CM

## 2022-12-31 MED ORDER — METOPROLOL TARTRATE 25 MG PO TABS: 25.0000 mg | ORAL_TABLET | Freq: Once | ORAL | 0 refills | Status: DC

## 2022-12-31 NOTE — Telephone Encounter (Signed)
TC to f/u with pt after yesterday's visit to [facility]. Pt states he is feeling a little better and has an appointment w/ cardiology today at 11:00. Pt has no questions or concerns to address at this time.

## 2022-12-31 NOTE — Patient Instructions (Addendum)
Medication Instructions:  Your physician recommends that you continue on your current medications as directed. Please refer to the Current Medication list given to you today.  *If you need a refill on your cardiac medications before your next appointment, please call your pharmacy*   Lab Work: BMET If you have labs (blood work) drawn today and your tests are completely normal, you will receive your results only by: [place] (if you have MyChart) OR A paper copy in the mail If you have any lab test that is abnormal or we need to change your treatment, we will call you to review the results.   Testing/Procedures: Your physician has requested that you have cardiac CT. Cardiac computed tomography (CT) is a painless test that uses an x-ray machine to take clear, detailed pictures of your heart. For further information please visit [email or web address]. Please follow instruction sheet as given.   Your physician has requested that you have an echocardiogram. Echocardiography is a painless test that uses sound waves to create images of your heart. It provides your doctor with information about the size and shape of your heart and how well your heart's chambers and valves are working. This procedure takes approximately one hour. There are no restrictions for this procedure. Please do NOT wear cologne, perfume, aftershave, or lotions (deodorant is allowed). Please arrive 15 minutes prior to your appointment time.     Follow-Up: At [facility], you and your health needs are our priority.  As part of our continuing mission to provide you with exceptional heart care, we have created designated Provider Care Teams.  These Care Teams include your primary Cardiologist (physician) and Advanced Practice Providers (APPs -  Physician Assistants and Nurse Practitioners) who all work together to provide you with the care you need, when you need it.   Your next appointment:   As  needed  Provider:   Dr. Ali Lowe  Other Instructions .   [facility] [place], [place] [address] [phone]  Please arrive at the [facility] and [facility]'s Entrance (Entrance C2) of [facility] 30 minutes prior to test start time. You can use the FREE valet parking offered at entrance C (encouraged to control the heart rate for the test)  Proceed to the [facility] Radiology Department (first floor) to check-in and test prep.  All radiology patients and guests should use entrance C2 at [facility], accessed from [facility], even though the hospital's physical address listed is [address].      Please follow these instructions carefully (unless otherwise directed):  Hold all erectile dysfunction medications at least 3 days (72 hrs) prior to test. (Ie viagra, cialis, sildenafil, tadalafil, etc) We will administer nitroglycerin during this exam.   On the Night Before the Test: Be sure to Drink plenty of water. Do not consume any caffeinated/decaffeinated beverages or chocolate 12 hours prior to your test. Do not take any antihistamines 12 hours prior to your test.  On the Day of the Test: Drink plenty of water until 1 hour prior to the test. Do not eat any food 1 hour prior to test. You may take your regular medications prior to the test.  Take metoprolol (Lopressor) two hours prior to test.        After the Test: Drink plenty of water. After receiving IV contrast, you may experience a mild flushed feeling. This is normal. On occasion, you may experience a mild rash up to 24  hours after the test. This is not dangerous. If this occurs, you can take Benadryl 25 mg and increase your fluid intake. If you experience trouble breathing, this can be serious. If it is severe call 911 IMMEDIATELY. If it is mild, please call our office. If you take any of these medications: Glipizide/Metformin, Avandament, Glucavance,  please do not take 48 hours after completing test unless otherwise instructed.  We will call to schedule your test 2-4 weeks out understanding that some insurance companies will need an authorization prior to the service being performed.   For non-scheduling related questions, please contact the cardiac imaging nurse navigator should you have any questions/concerns: Marchia Bond, Cardiac Imaging Nurse Navigator Gordy Clement, Cardiac Imaging Nurse Navigator [facility] Direct Office Dial: [phone]   For scheduling needs, including cancellations and rescheduling, please call [place], [phone].

## 2023-01-01 LAB — BASIC METABOLIC PANEL
BUN/Creatinine Ratio: 15 (ref 9–20)
BUN: 13 mg/dL (ref 6–24)
CO2: 23 mmol/L (ref 20–29)
Calcium: 9.4 mg/dL (ref 8.7–10.2)
Chloride: 105 mmol/L (ref 96–106)
Creatinine, Ser: 0.86 mg/dL (ref 0.76–1.27)
Glucose: 99 mg/dL (ref 70–99)
Potassium: 4.4 mmol/L (ref 3.5–5.2)
Sodium: 142 mmol/L (ref 134–144)
eGFR: 112 mL/min/{1.73_m2} (ref >59)

## 2023-01-06 ENCOUNTER — Telehealth (HOSPITAL_COMMUNITY): Payer: Self-pay | Admitting: *Deleted

## 2023-01-06 NOTE — Telephone Encounter (Signed)
Reaching out to patient to offer assistance regarding upcoming cardiac imaging study; pt verbalizes understanding of appt date/time, parking situation and where to check in, pre-test NPO status and verified current allergies; name and call back number provided for further questions should they arise  Sarahlynn Cisnero RN Navigator Cardiac Imaging [facility] [phone] office [phone] cell  Patient aware to arrive at 7:30am. 

## 2023-01-07 ENCOUNTER — Ambulatory Visit (HOSPITAL_COMMUNITY)
Admission: RE | Admit: 2023-01-07 | Discharge: 2023-01-07 | Disposition: A | Discharge status: Home / Self Care | Payer: BC Managed Care – PPO | Source: Ambulatory Visit | Attending: Internal Medicine | Admitting: Internal Medicine

## 2023-01-07 DIAGNOSIS — R072 Precordial pain: Secondary | ICD-10-CM | POA: Diagnosis not present

## 2023-01-07 MED ORDER — NITROGLYCERIN 0.4 MG SL SUBL
0.8000 mg | SUBLINGUAL_TABLET | Freq: Once | SUBLINGUAL | Status: AC
  Administered 2023-01-07: 0.8 mg via SUBLINGUAL

## 2023-01-07 MED ORDER — IOHEXOL 350 MG/ML SOLN
100.0000 mL | Freq: Once | INTRAVENOUS | Status: AC | PRN
  Administered 2023-01-07: 100 mL via INTRAVENOUS

## 2023-01-07 MED ORDER — NITROGLYCERIN 0.4 MG SL SUBL
SUBLINGUAL_TABLET | SUBLINGUAL | Status: AC
  Filled 2023-01-07: qty 2

## 2023-01-27 ENCOUNTER — Ambulatory Visit (HOSPITAL_COMMUNITY): Payer: BC Managed Care – PPO | Attending: Cardiology

## 2023-01-27 DIAGNOSIS — R072 Precordial pain: Secondary | ICD-10-CM | POA: Diagnosis not present

## 2023-01-27 DIAGNOSIS — E785 Hyperlipidemia, unspecified: Secondary | ICD-10-CM | POA: Diagnosis not present

## 2023-01-27 LAB — ECHOCARDIOGRAM COMPLETE
Area-P 1/2: 2.9 cm2
S' Lateral: 3 cm

## 2023-03-14 ENCOUNTER — Other Ambulatory Visit: Payer: Self-pay

## 2023-03-14 ENCOUNTER — Ambulatory Visit
Admission: RE | Admit: 2023-03-14 | Discharge: 2023-03-14 | Disposition: A | Discharge status: Home / Self Care | Payer: BC Managed Care – PPO | Source: Ambulatory Visit | Attending: Family Medicine | Admitting: Family Medicine

## 2023-03-14 VITALS — BP 121/78 | HR 79 | Temp 97.9°F | Resp 16 | Ht 69.0 in | Wt 228.0 lb

## 2023-03-14 DIAGNOSIS — K602 Anal fissure, unspecified: Secondary | ICD-10-CM | POA: Diagnosis not present

## 2023-03-14 MED ORDER — NIFEDIPINE 0.3 % OINTMENT: 1.0000 | TOPICAL_OINTMENT | Freq: Four times a day (QID) | CUTANEOUS | 0 refills | Status: DC

## 2023-03-14 NOTE — ED Provider Notes (Signed)
[facility]    CSN: [id number] Arrival date & time: 03/14/23  1016      History   Chief Complaint Chief Complaint  Patient presents with   Hemorrhoids    Entered by patient    HPI Carlos Lambert is a 42 y.o. male.   HPI 42 year old male presents with burning and discomfort in his anus x 5 days.  Patient reports worse when sitting down not with BMs.  Patient is concerned with external hemorrhoids.  PMH significant for obesity, hypercholesterolemia, and GERD.  Past Medical History:  Diagnosis Date   Chest pain    GERD (gastroesophageal reflux disease)    Hypercholesteremia    Pain in leg, unspecified     Patient Active Problem List   Diagnosis Date Noted   Pain and swelling of left lower leg 05/14/2022   Vitamin D deficiency 04/04/2022   Encounter for well adult exam with abnormal findings 03/31/2022   GERD (gastroesophageal reflux disease) 03/31/2022   Dizziness 03/31/2022   Soft tissue mass 03/11/2021   Dyslipidemia 03/05/2020   Prediabetes 03/05/2020   Elevated triglycerides with high cholesterol 04/02/2018   History of prediabetes 04/02/2018    Past Surgical History:  Procedure Laterality Date   KNEE SURGERY         Home Medications    Prior to Admission medications   Medication Sig Start Date End Date Taking? Authorizing Provider  nifedipine 0.3 % ointment Place 1 Application rectally 4 (four) times daily. 03/14/23  Yes Trevor Iha, FNP  metoprolol tartrate (LOPRESSOR) 25 MG tablet Take 1 tablet (25 mg total) by mouth once for 1 dose. 12/31/22 12/31/22  Orbie Pyo, MD  pantoprazole (PROTONIX) 40 MG tablet Take 1 tablet (40 mg total) by mouth daily. 03/31/22   Corwin Levins, MD  rosuvastatin (CRESTOR) 20 MG tablet Take 1 tablet (20 mg total) by mouth daily. 03/31/22   Corwin Levins, MD    Family History Family History  Problem Relation Age of Onset   Hypertension Father    Hyperlipidemia Father    Cancer Mother        LUNG     Social History Social History   Tobacco Use   Smoking status: Former   Smokeless tobacco: Never  Building services engineer Use: Never used  Substance Use Topics   Alcohol use: Not Currently   Drug use: No     Allergies   Patient has no known allergies.   Review of Systems Review of Systems  Gastrointestinal:  Positive for rectal pain.  All other systems reviewed and are negative.    Physical Exam Triage Vital Signs ED Triage Vitals  Enc Vitals Group     BP 03/14/23 1033 121/78     Pulse Rate 03/14/23 1033 79     Resp 03/14/23 1033 16     Temp 03/14/23 1033 97.9 F (36.6 C)     Temp Source 03/14/23 1033 Oral     SpO2 03/14/23 1033 95 %     Weight 03/14/23 1035 228 lb (103.4 kg)     Height 03/14/23 1035 5\' 9"  (1.753 m)     Head Circumference --      Peak Flow --      Pain Score 03/14/23 1035 4     Pain Loc --      Pain Edu? --      Excl. in [place]? --    No data found.  Updated Vital Signs BP 121/78 (BP  Location: Right Arm)   Pulse 79   Temp 97.9 F (36.6 C) (Oral)   Resp 16   Ht 5\' 9"  (1.753 m)   Wt 228 lb (103.4 kg)   SpO2 95%   BMI 33.67 kg/m      Physical Exam Vitals and nursing note reviewed.  Constitutional:      Appearance: Normal appearance. He is obese.  HENT:     Head: Normocephalic and atraumatic.     Mouth/Throat:     Mouth: Mucous membranes are moist.     Pharynx: Oropharynx is clear.  Eyes:     Extraocular Movements: Extraocular movements intact.     Conjunctiva/sclera: Conjunctivae normal.     Pupils: Pupils are equal, round, and reactive to light.  Cardiovascular:     Rate and Rhythm: Normal rate and regular rhythm.     Pulses: Normal pulses.     Heart sounds: Normal heart sounds.  Pulmonary:     Effort: Pulmonary effort is normal.     Breath sounds: Normal breath sounds. No wheezing, rhonchi or rales.  Genitourinary:    Comments: Anal rectal: External sphincter (6 o'clock position): Anal fissure noted ~1.0 cm x 1.0 cm x 1.0  cm pendant shaped mildly erythematous painful (per patient) anal fissure noted noted Musculoskeletal:        General: Normal range of motion.     Cervical back: Normal range of motion and neck supple.  Skin:    General: Skin is warm and dry.  Neurological:     General: No focal deficit present.     Mental Status: He is alert and oriented to person, place, and time. Mental status is at baseline.  Psychiatric:        Mood and Affect: Mood normal.        Behavior: Behavior normal.        Thought Content: Thought content normal.      UC Treatments / Results  Labs (all labs ordered are listed, but only abnormal results are displayed) Labs Reviewed - No data to display  EKG   Radiology No results found.  Procedures Procedures (including critical care time)  Medications Ordered in UC Medications - No data to display  Initial Impression / Assessment and Plan / UC Course  I have reviewed the triage vital signs and the nursing notes.  Pertinent labs & imaging results that were available during my care of the patient were reviewed by me and considered in my medical decision making (see chart for details).     MDM: 1.  Anal fissure-Rx'd Nifedipine 0.3% ointment: Place 1 application rectally 4 times daily. Encouraged increase daily water intake to 64 ounces per day while taking this medication.  Advised patient if symptoms worsen and/or unresolved please follow-up with PCP or GI for further evaluation.  Patient discharged home, hemodynamically stable.  Final Clinical Impressions(s) / UC Diagnoses   Final diagnoses:  Anal fissure     Discharge Instructions      Encouraged increase daily water intake to 64 ounces per day while taking this medication.  Advised patient if symptoms worsen and/or unresolved please follow-up with PCP or GI for further evaluation.     ED Prescriptions     Medication Sig Dispense Auth. Provider   nifedipine 0.3 % ointment Place 1 Application  rectally 4 (four) times daily. 30 g Trevor Iha, FNP      PDMP not reviewed this encounter.   Trevor Iha, FNP 03/14/23 1141

## 2023-03-14 NOTE — ED Triage Notes (Signed)
Patient c/o burning and discomfort in his anus x 5 days.  Worse when sitting down, not with BM's.  Patient denies any OTC pain meds.

## 2023-03-14 NOTE — Discharge Instructions (Addendum)
Encouraged increase daily water intake to 64 ounces per day while taking this medication.  Advised patient if symptoms worsen and/or unresolved please follow-up with PCP or GI for further evaluation.

## 2023-08-11 ENCOUNTER — Ambulatory Visit (INDEPENDENT_AMBULATORY_CARE_PROVIDER_SITE_OTHER): Payer: BC Managed Care – PPO

## 2023-08-11 ENCOUNTER — Ambulatory Visit (INDEPENDENT_AMBULATORY_CARE_PROVIDER_SITE_OTHER): Payer: BC Managed Care – PPO | Admitting: Emergency Medicine

## 2023-08-11 VITALS — BP 124/74 | HR 57 | Temp 98.2°F | Ht 69.0 in | Wt 231.4 lb

## 2023-08-11 DIAGNOSIS — M25531 Pain in right wrist: Secondary | ICD-10-CM

## 2023-08-11 MED ORDER — MELOXICAM 15 MG PO TABS
15.0000 mg | ORAL_TABLET | Freq: Every day | ORAL | 0 refills | Status: DC
Start: 2023-08-11 — End: 2023-09-21

## 2023-08-11 NOTE — Progress Notes (Signed)
Carlos Lambert 42 y.o.   Chief Complaint  Patient presents with   Wrist Pain    Patients he is having som wrist and palm pain( right hand) started about a few weeks ago    HISTORY OF PRESENT ILLNESS: Acute problem visit today. This is a 42 y.o. male complaining of right wrist and hand pain on and off for several weeks. Physical work.  Right-handed.  Uses right hand a lot during the day. Occasional tingling to right hand but no other associated symptoms No other complaints or medical concerns today.  Wrist Pain  Pertinent negatives include no fever.     Prior to Admission medications   Medication Sig Start Date End Date Taking? Authorizing Provider  meloxicam (MOBIC) 15 MG tablet Take 1 tablet (15 mg total) by mouth daily. 08/11/23  Yes Zacharie Portner, Eilleen Kempf, MD  metoprolol tartrate (LOPRESSOR) 25 MG tablet Take 1 tablet (25 mg total) by mouth once for 1 dose. 12/31/22 12/31/22  Orbie Pyo, MD  nifedipine 0.3 % ointment Place 1 Application rectally 4 (four) times daily. Patient not taking: Reported on 08/11/2023 03/14/23   Trevor Iha, FNP  pantoprazole (PROTONIX) 40 MG tablet Take 1 tablet (40 mg total) by mouth daily. Patient not taking: Reported on 08/11/2023 03/31/22   Corwin Levins, MD  rosuvastatin (CRESTOR) 20 MG tablet Take 1 tablet (20 mg total) by mouth daily. Patient not taking: Reported on 08/11/2023 03/31/22   Corwin Levins, MD    No Known Allergies  Patient Active Problem List   Diagnosis Date Noted   Pain and swelling of left lower leg 05/14/2022   Vitamin D deficiency 04/04/2022   Encounter for well adult exam with abnormal findings 03/31/2022   GERD (gastroesophageal reflux disease) 03/31/2022   Dizziness 03/31/2022   Soft tissue mass 03/11/2021   Dyslipidemia 03/05/2020   Prediabetes 03/05/2020   Elevated triglycerides with high cholesterol 04/02/2018   History of prediabetes 04/02/2018    Past Medical History:  Diagnosis Date   Chest pain     GERD (gastroesophageal reflux disease)    Hypercholesteremia    Pain in leg, unspecified     Past Surgical History:  Procedure Laterality Date   KNEE SURGERY      Social History   Socioeconomic History   Marital status: Divorced    Spouse name: Not on file   Number of children: 1   Years of education: Not on file   Highest education level: Some college, no degree  Occupational History   Not on file  Tobacco Use   Smoking status: Former   Smokeless tobacco: Never  Vaping Use   Vaping status: Never Used  Substance and Sexual Activity   Alcohol use: Not Currently   Drug use: No   Sexual activity: Not on file  Other Topics Concern   Not on file  Social History Narrative    Family originally from [place] and ancestors from [place]   Social Determinants of Health   Financial Resource Strain: Low Risk  (08/11/2023)   Overall Financial Resource Strain (CARDIA)    Difficulty of Paying Living Expenses: Not very hard  Food Insecurity: No Food Insecurity (08/11/2023)   Hunger Vital Sign    Worried About Running Out of Food in the Last Year: Never true    Ran Out of Food in the Last Year: Never true  Transportation Needs: No Transportation Needs (08/11/2023)   PRAPARE - Administrator, Civil Service (Medical): No  Lack of Transportation (Non-Medical): No  Physical Activity: Insufficiently Active (08/11/2023)   Exercise Vital Sign    Days of Exercise per Week: 2 days    Minutes of Exercise per Session: 30 min  Stress: No Stress Concern Present (08/11/2023)   [employer] of Occupational Health - Occupational Stress Questionnaire    Feeling of Stress : Not at all  Social Connections: Moderately Integrated (08/11/2023)   Social Connection and Isolation Panel [NHANES]    Frequency of Communication with Friends and Family: More than three times a week    Frequency of Social Gatherings with Friends and Family: More than three times a week    Attends  Religious Services: More than 4 times per year    Active Member of [employer] or Organizations: Yes    Attends Engineer, structural: More than 4 times per year    Marital Status: Divorced  Catering manager Violence: Not on file    Family History  Problem Relation Age of Onset   Hypertension Father    Hyperlipidemia Father    Cancer Mother        LUNG     Review of Systems  Constitutional: Negative.  Negative for chills and fever.  HENT: Negative.  Negative for congestion and sore throat.   Respiratory: Negative.  Negative for cough and shortness of breath.   Cardiovascular: Negative.  Negative for chest pain and palpitations.  Gastrointestinal:  Negative for abdominal pain, diarrhea, nausea and vomiting.  Genitourinary: Negative.  Negative for dysuria and hematuria.  Musculoskeletal:        Right wrist and right hand pain  Skin: Negative.  Negative for rash.  Neurological: Negative.  Negative for dizziness and headaches.  All other systems reviewed and are negative.   Vitals:   08/11/23 1542  BP: 124/74  Pulse: (!) 57  Temp: 98.2 F (36.8 C)  SpO2: 96%    Physical Exam Vitals reviewed.  Constitutional:      Appearance: Normal appearance.  HENT:     Head: Normocephalic.  Eyes:     Extraocular Movements: Extraocular movements intact.  Cardiovascular:     Rate and Rhythm: Normal rate.  Pulmonary:     Effort: Pulmonary effort is normal.  Musculoskeletal:     Comments: Right wrist: Full range of motion.  No significant tenderness.  No swelling. Right hand: Full range of motion.  Good grip.  Neurovascularly intact.  Skin:    General: Skin is warm and dry.     Capillary Refill: Capillary refill takes less than 2 seconds.  Neurological:     Mental Status: He is alert and oriented to person, place, and time.  Psychiatric:        Mood and Affect: Mood normal.        Behavior: Behavior normal.      ASSESSMENT & PLAN: A total of 32 minutes was spent with the  patient and counseling/coordination of care regarding preparing for this visit, review of most recent office visit notes, review of all medications, differential diagnosis of right wrist and hand pain, need for x-ray today, pain management, possible need for orthopedic evaluation, prognosis, documentation, and need for follow-up if no better or worse during the next couple weeks.  Problem List Items Addressed This Visit       Other   Right wrist pain - Primary    Differential diagnosis discussed including possibility of early carpal tunnel syndrome Recommend x-ray today Pain management discussed Recommend daily meloxicam 15 mg  for 2 weeks May need orthopedic referral depending on progression of this condition      Relevant Medications   meloxicam (MOBIC) 15 MG tablet   Other Relevant Orders   DG Wrist 2 Views Right   DG Hand 2 View Right   Patient Instructions  Wrist Pain, Adult There are many things that can cause wrist pain. Some common causes include: An injury to the wrist. Using the joint too much. A condition that causes too much pressure to be put on a nerve in the wrist (carpal tunnel syndrome). Wear and tear of the joints that happens as a person gets older (osteoarthritis). A condition that causes swelling and stiffness in the joints (arthritis). Sometimes, the cause of wrist pain is not known. Often, the pain goes away when you follow your doctor's instructions for easing pain at home. This may include resting your wrist, icing your wrist, or using a splint or an elastic wrap for a short time. It is important to tell your doctor if your wrist pain does not go away. Follow these instructions at home: If you have a splint or elastic wrap that can be taken off: Wear the splint or wrap as told by your doctor. Take it off only as told by your doctor. Ask if you can take it off for bathing. Check the skin around the splint or wrap every day. Tell your doctor if you see  problems. Loosen the splint or wrap if your fingers: Tingle. Become numb. Turn cold and blue. Keep the splint or wrap clean. If the splint or wrap is not waterproof: Do not let it get wet. Cover it with a watertight covering when you take a bath or shower. Managing pain, stiffness, and swelling  If told, put ice on the painful area. If you have a removable splint or wrap, take it off as told by your doctor. Put ice in a plastic bag. Place a towel between your skin and the bag or between your splint or wrap and the bag. Leave the ice on for 20 minutes, 2-3 times a day. If your skin turns bright red, take off the ice right away to prevent skin damage. The risk of damage is higher if you cannot feel pain, heat, or cold. Move your fingers often. Raise the injured area above the level of your heart while you are sitting or lying down. Activity Rest your wrist as told by your doctor. Return to your normal activities when your doctor says that it is safe. Ask your doctor when it is safe to drive if you have a splint or wrap on your wrist. Do exercises as told by your doctor. General instructions Pay attention to any changes in your symptoms. Take over-the-counter and prescription medicines only as told by your doctor. Contact a doctor if: You have a sudden, sharp pain in the wrist, hand, or arm that is different or new. Any swelling or bruising on your wrist or hand gets worse. Your skin: Becomes red. Has a rash. Has open sores. Your pain does not get better. Your pain gets worse. You have a fever or chills. Get help right away if: You lose feeling in your fingers or hand. Your fingers turn white, very red, or cold and blue. You cannot move your fingers. This information is not intended to replace advice given to you by your health care provider. Make sure you discuss any questions you have with your health care provider. Document Revised: 07/03/2022 Document Reviewed:  07/03/2022 Elsevier Patient Education  2024 Elsevier Inc.      Edwina Barth, MD [facility]

## 2023-08-11 NOTE — Patient Instructions (Signed)
Wrist Pain, Adult There are many things that can cause wrist pain. Some common causes include: An injury to the wrist. Using the joint too much. A condition that causes too much pressure to be put on a nerve in the wrist (carpal tunnel syndrome). Wear and tear of the joints that happens as a person gets older (osteoarthritis). A condition that causes swelling and stiffness in the joints (arthritis). Sometimes, the cause of wrist pain is not known. Often, the pain goes away when you follow your doctor's instructions for easing pain at home. This may include resting your wrist, icing your wrist, or using a splint or an elastic wrap for a short time. It is important to tell your doctor if your wrist pain does not go away. Follow these instructions at home: If you have a splint or elastic wrap that can be taken off: Wear the splint or wrap as told by your doctor. Take it off only as told by your doctor. Ask if you can take it off for bathing. Check the skin around the splint or wrap every day. Tell your doctor if you see problems. Loosen the splint or wrap if your fingers: Tingle. Become numb. Turn cold and blue. Keep the splint or wrap clean. If the splint or wrap is not waterproof: Do not let it get wet. Cover it with a watertight covering when you take a bath or shower. Managing pain, stiffness, and swelling  If told, put ice on the painful area. If you have a removable splint or wrap, take it off as told by your doctor. Put ice in a plastic bag. Place a towel between your skin and the bag or between your splint or wrap and the bag. Leave the ice on for 20 minutes, 2-3 times a day. If your skin turns bright red, take off the ice right away to prevent skin damage. The risk of damage is higher if you cannot feel pain, heat, or cold. Move your fingers often. Raise the injured area above the level of your heart while you are sitting or lying down. Activity Rest your wrist as told by your  doctor. Return to your normal activities when your doctor says that it is safe. Ask your doctor when it is safe to drive if you have a splint or wrap on your wrist. Do exercises as told by your doctor. General instructions Pay attention to any changes in your symptoms. Take over-the-counter and prescription medicines only as told by your doctor. Contact a doctor if: You have a sudden, sharp pain in the wrist, hand, or arm that is different or new. Any swelling or bruising on your wrist or hand gets worse. Your skin: Becomes red. Has a rash. Has open sores. Your pain does not get better. Your pain gets worse. You have a fever or chills. Get help right away if: You lose feeling in your fingers or hand. Your fingers turn white, very red, or cold and blue. You cannot move your fingers. This information is not intended to replace advice given to you by your health care provider. Make sure you discuss any questions you have with your health care provider. Document Revised: 07/03/2022 Document Reviewed: 07/03/2022 Elsevier Patient Education  2024 [employer].

## 2023-08-11 NOTE — Assessment & Plan Note (Signed)
Differential diagnosis discussed including possibility of early carpal tunnel syndrome Recommend x-ray today Pain management discussed Recommend daily meloxicam 15 mg for 2 weeks May need orthopedic referral depending on progression of this condition

## 2023-08-16 ENCOUNTER — Ambulatory Visit: Payer: BC Managed Care – PPO | Admitting: Emergency Medicine

## 2023-09-12 ENCOUNTER — Ambulatory Visit
Admission: RE | Admit: 2023-09-12 | Discharge: 2023-09-12 | Disposition: A | Discharge status: Home / Self Care | Payer: BC Managed Care – PPO | Source: Ambulatory Visit | Attending: Family Medicine | Admitting: Family Medicine

## 2023-09-12 ENCOUNTER — Ambulatory Visit: Payer: BC Managed Care – PPO

## 2023-09-12 ENCOUNTER — Other Ambulatory Visit: Payer: Self-pay

## 2023-09-12 VITALS — BP 134/84 | HR 68 | Temp 97.8°F | Resp 16 | Ht 69.0 in | Wt 228.0 lb

## 2023-09-12 DIAGNOSIS — R0789 Other chest pain: Secondary | ICD-10-CM

## 2023-09-12 DIAGNOSIS — R079 Chest pain, unspecified: Secondary | ICD-10-CM | POA: Diagnosis not present

## 2023-09-12 NOTE — ED Provider Notes (Signed)
[facility]    CSN: [id number] Arrival date & time: 09/12/23  0347      History   Chief Complaint Chief Complaint  Patient presents with   Chest Pain    HPI [place] is a 42 y.o. male.   HPI  Past Medical History:  Diagnosis Date   Chest pain    GERD (gastroesophageal reflux disease)    Hypercholesteremia    Pain in leg, unspecified     Patient Active Problem List   Diagnosis Date Noted   Right wrist pain 08/11/2023   Pain and swelling of left lower leg 05/14/2022   Vitamin D deficiency 04/04/2022   Encounter for well adult exam with abnormal findings 03/31/2022   GERD (gastroesophageal reflux disease) 03/31/2022   Dizziness 03/31/2022   Soft tissue mass 03/11/2021   Dyslipidemia 03/05/2020   Prediabetes 03/05/2020   Elevated triglycerides with high cholesterol 04/02/2018   History of prediabetes 04/02/2018    Past Surgical History:  Procedure Laterality Date   KNEE SURGERY         Home Medications    Prior to Admission medications   Medication Sig Start Date End Date Taking? Authorizing Provider  meloxicam (MOBIC) 15 MG tablet Take 1 tablet (15 mg total) by mouth daily. 08/11/23  Yes Sagardia, Eilleen Kempf, MD  metoprolol tartrate (LOPRESSOR) 25 MG tablet Take 1 tablet (25 mg total) by mouth once for 1 dose. 12/31/22 12/31/22  Orbie Pyo, MD  nifedipine 0.3 % ointment Place 1 Application rectally 4 (four) times daily. Patient not taking: Reported on 08/11/2023 03/14/23   Trevor Iha, FNP  pantoprazole (PROTONIX) 40 MG tablet Take 1 tablet (40 mg total) by mouth daily. Patient not taking: Reported on 08/11/2023 03/31/22   Corwin Levins, MD  rosuvastatin (CRESTOR) 20 MG tablet Take 1 tablet (20 mg total) by mouth daily. Patient not taking: Reported on 08/11/2023 03/31/22   Corwin Levins, MD    Family History Family History  Problem Relation Age of Onset   Hypertension Father    Hyperlipidemia Father    Cancer Mother         LUNG    Social History Social History   Tobacco Use   Smoking status: Former   Smokeless tobacco: Never  Vaping Use   Vaping status: Never Used  Substance Use Topics   Alcohol use: Not Currently   Drug use: No     Allergies   Patient has no known allergies.   Review of Systems Review of Systems   Physical Exam Triage Vital Signs ED Triage Vitals  Encounter Vitals Group     BP 09/12/23 0951 134/84     Systolic BP Percentile --      Diastolic BP Percentile --      Pulse Rate 09/12/23 0951 68     Resp 09/12/23 0951 16     Temp 09/12/23 0951 97.8 F (36.6 C)     Temp Source 09/12/23 0951 Oral     SpO2 09/12/23 0951 97 %     Weight 09/12/23 0953 228 lb (103.4 kg)     Height 09/12/23 0953 5\' 9"  (1.753 m)     Head Circumference --      Peak Flow --      Pain Score 09/12/23 0953 1     Pain Loc --      Pain Education --      Exclude from Growth Chart --    No data found.  Updated Vital Signs BP 134/84 (BP Location: Right Arm)   Pulse 68   Temp 97.8 F (36.6 C) (Oral)   Resp 16   Ht 5\' 9"  (1.753 m)   Wt 103.4 kg   SpO2 97%   BMI 33.67 kg/m   Visual Acuity Right Eye Distance:   Left Eye Distance:   Bilateral Distance:    Right Eye Near:   Left Eye Near:    Bilateral Near:     Physical Exam   UC Treatments / Results  Labs (all labs ordered are listed, but only abnormal results are displayed) Labs Reviewed - No data to display  EKG   Radiology DG Sternum  Result Date: 09/12/2023 CLINICAL DATA:  Sternal pain and tenderness to palpation. EXAM: STERNUM - 2+ VIEW COMPARISON:  None Available. FINDINGS: There is no evidence of fracture or other focal bone lesions involving the sternum. Heart size and mediastinal contours are normal. Both lungs are clear. IMPRESSION: Negative. Electronically Signed   By: Danae Orleans M.D.   On: 09/12/2023 11:17    Procedures Procedures (including critical care time)  Medications Ordered in UC Medications - No data  to display  Initial Impression / Assessment and Plan / UC Course  I have reviewed the triage vital signs and the nursing notes.  Pertinent labs & imaging results that were available during my care of the patient were reviewed by me and considered in my medical decision making (see chart for details).    Sternum x-ray normal. Apply Voltaren gel BID. Followup with Family Doctor if not improved in about 3 weeks.  Final Clinical Impressions(s) / UC Diagnoses   Final diagnoses:  Xiphoidalgia     Discharge Instructions      Apply Voltaren gel 1% (diclofenac) to your lower sternum (xiphoid process) twice daily for about a week.  May also apply ice pack two or three times daily for about 15 minutes.    ED Prescriptions   None    PDMP not reviewed this encounter.

## 2023-09-12 NOTE — ED Triage Notes (Signed)
Patient c/o centralized chest pain, feels like when he eats, food gets stuck right there x 3-4 days.  Patient does have a history of acid reflux.  Denies any SOB.  Patient has not taken OTC reflux medications.

## 2023-09-12 NOTE — Discharge Instructions (Signed)
Apply Voltaren gel 1% (diclofenac) to your lower sternum (xiphoid process) twice daily for about a week.  May also apply ice pack two or three times daily for about 15 minutes.

## 2023-09-21 ENCOUNTER — Encounter: Payer: Self-pay | Admitting: Emergency Medicine

## 2023-09-21 ENCOUNTER — Ambulatory Visit: Payer: BC Managed Care – PPO | Admitting: Emergency Medicine

## 2023-09-21 VITALS — BP 114/78 | HR 68 | Temp 98.2°F | Ht 69.0 in | Wt 229.4 lb

## 2023-09-21 DIAGNOSIS — R7303 Prediabetes: Secondary | ICD-10-CM

## 2023-09-21 DIAGNOSIS — E785 Hyperlipidemia, unspecified: Secondary | ICD-10-CM | POA: Diagnosis not present

## 2023-09-21 LAB — CBC WITH DIFFERENTIAL/PLATELET
Basophils Absolute: 0 10*3/uL (ref 0.0–0.1)
Basophils Relative: 0.6 % (ref 0.0–3.0)
Eosinophils Absolute: 0.1 10*3/uL (ref 0.0–0.7)
Eosinophils Relative: 1.2 % (ref 0.0–5.0)
HCT: 41.9 % (ref 39.0–52.0)
Hemoglobin: 14 g/dL (ref 13.0–17.0)
Lymphocytes Relative: 29.6 % (ref 12.0–46.0)
Lymphs Abs: 2.2 10*3/uL (ref 0.7–4.0)
MCHC: 33.5 g/dL (ref 30.0–36.0)
MCV: 87.2 fL (ref 78.0–100.0)
Monocytes Absolute: 0.6 10*3/uL (ref 0.1–1.0)
Monocytes Relative: 8.2 % (ref 3.0–12.0)
Neutro Abs: 4.4 10*3/uL (ref 1.4–7.7)
Neutrophils Relative %: 60.4 % (ref 43.0–77.0)
Platelets: 243 10*3/uL (ref 150.0–400.0)
RBC: 4.8 Mil/uL (ref 4.22–5.81)
RDW: 13.5 % (ref 11.5–15.5)
WBC: 7.3 10*3/uL (ref 4.0–10.5)

## 2023-09-21 LAB — VITAMIN D 25 HYDROXY (VIT D DEFICIENCY, FRACTURES): VITD: 13.29 ng/mL — ABNORMAL LOW (ref 30.00–100.00)

## 2023-09-21 LAB — VITAMIN B12: Vitamin B-12: 603 pg/mL (ref 211–911)

## 2023-09-21 LAB — COMPREHENSIVE METABOLIC PANEL
ALT: 43 U/L (ref 0–53)
AST: 24 U/L (ref 0–37)
Albumin: 4.7 g/dL (ref 3.5–5.2)
Alkaline Phosphatase: 67 U/L (ref 39–117)
BUN: 13 mg/dL (ref 6–23)
CO2: 31 meq/L (ref 19–32)
Calcium: 9.3 mg/dL (ref 8.4–10.5)
Chloride: 102 meq/L (ref 96–112)
Creatinine, Ser: 1.04 mg/dL (ref 0.40–1.50)
GFR: 89.66 mL/min (ref >60.00)
Glucose, Bld: 85 mg/dL (ref 70–99)
Potassium: 4.1 meq/L (ref 3.5–5.1)
Sodium: 139 meq/L (ref 135–145)
Total Bilirubin: 0.6 mg/dL (ref 0.2–1.2)
Total Protein: 7.4 g/dL (ref 6.0–8.3)

## 2023-09-21 LAB — LIPID PANEL
Cholesterol: 269 mg/dL — ABNORMAL HIGH (ref 0–200)
HDL: 30.6 mg/dL — ABNORMAL LOW (ref >39.00)
Total CHOL/HDL Ratio: 9
Triglycerides: 419 mg/dL — ABNORMAL HIGH (ref 0.0–149.0)

## 2023-09-21 LAB — LDL CHOLESTEROL, DIRECT: Direct LDL: 123 mg/dL

## 2023-09-21 LAB — HEMOGLOBIN A1C: Hgb A1c MFr Bld: 5.9 % (ref 4.6–6.5)

## 2023-09-21 NOTE — Assessment & Plan Note (Signed)
Chronic stable condition Blood work done today including hemoglobin A1c Cardiovascular risks associated with prediabetes and dyslipidemia discussed Diet and nutrition discussed Benefits of exercise discussed

## 2023-09-21 NOTE — Progress Notes (Signed)
Carlos Lambert 42 y.o.   Chief Complaint  Patient presents with   Abdominal Pain    Patient states he's had a stomach discomfort he states that felt like acid reflux. Which has gone away but is here to just follow up on. When it was going on he states it felt like something was stuck right the top of his stomach and was feeling fatigue also. He did go to urgent care and nothing was seen but did have bruising to his sternum and they gave him a cream.     HISTORY OF PRESENT ILLNESS: This is a 42 y.o. male complaining of transient episode of heartburn and epigastric discomfort last week.  Much better today.  Asymptomatic. History of dyslipidemia and prediabetes.  Needs follow-up and blood work No other complaints or medical concerns today.  Abdominal Pain Pertinent negatives include no dysuria, fever, headaches or hematuria.     Prior to Admission medications   Medication Sig Start Date End Date Taking? Authorizing Provider  meloxicam (MOBIC) 15 MG tablet Take 1 tablet (15 mg total) by mouth daily. Patient not taking: Reported on 09/21/2023 08/11/23   Georgina Quint, MD  metoprolol tartrate (LOPRESSOR) 25 MG tablet Take 1 tablet (25 mg total) by mouth once for 1 dose. 12/31/22 12/31/22  Orbie Pyo, MD  nifedipine 0.3 % ointment Place 1 Application rectally 4 (four) times daily. Patient not taking: Reported on 08/11/2023 03/14/23   Trevor Iha, FNP  pantoprazole (PROTONIX) 40 MG tablet Take 1 tablet (40 mg total) by mouth daily. Patient not taking: Reported on 08/11/2023 03/31/22   Corwin Levins, MD  rosuvastatin (CRESTOR) 20 MG tablet Take 1 tablet (20 mg total) by mouth daily. Patient not taking: Reported on 08/11/2023 03/31/22   Corwin Levins, MD    No Known Allergies  Patient Active Problem List   Diagnosis Date Noted   Vitamin D deficiency 04/04/2022   Encounter for well adult exam with abnormal findings 03/31/2022   GERD (gastroesophageal reflux disease) 03/31/2022    Dyslipidemia 03/05/2020   Prediabetes 03/05/2020   Elevated triglycerides with high cholesterol 04/02/2018   History of prediabetes 04/02/2018    Past Medical History:  Diagnosis Date   Chest pain    GERD (gastroesophageal reflux disease)    Hypercholesteremia    Pain in leg, unspecified     Past Surgical History:  Procedure Laterality Date   KNEE SURGERY      Social History   Socioeconomic History   Marital status: Divorced    Spouse name: Not on file   Number of children: 1   Years of education: Not on file   Highest education level: Some college, no degree  Occupational History   Not on file  Tobacco Use   Smoking status: Former   Smokeless tobacco: Never  Vaping Use   Vaping status: Never Used  Substance and Sexual Activity   Alcohol use: Not Currently   Drug use: No   Sexual activity: Not on file  Other Topics Concern   Not on file  Social History Narrative    Family originally from [place] and ancestors from [place]   Social Determinants of Health   Financial Resource Strain: Low Risk  (08/11/2023)   Overall Financial Resource Strain (CARDIA)    Difficulty of Paying Living Expenses: Not very hard  Food Insecurity: No Food Insecurity (08/11/2023)   Hunger Vital Sign    Worried About Running Out of Food in the Last Year: Never  true    Ran Out of Food in the Last Year: Never true  Transportation Needs: No Transportation Needs (08/11/2023)   PRAPARE - Administrator, Civil Service (Medical): No    Lack of Transportation (Non-Medical): No  Physical Activity: Insufficiently Active (08/11/2023)   Exercise Vital Sign    Days of Exercise per Week: 2 days    Minutes of Exercise per Session: 30 min  Stress: No Stress Concern Present (08/11/2023)   [employer] of Occupational Health - Occupational Stress Questionnaire    Feeling of Stress : Not at all  Social Connections: Moderately Integrated (08/11/2023)   Social Connection and  Isolation Panel [NHANES]    Frequency of Communication with Friends and Family: More than three times a week    Frequency of Social Gatherings with Friends and Family: More than three times a week    Attends Religious Services: More than 4 times per year    Active Member of [employer] or Organizations: Yes    Attends Engineer, structural: More than 4 times per year    Marital Status: Divorced  Catering manager Violence: Not on file    Family History  Problem Relation Age of Onset   Hypertension Father    Hyperlipidemia Father    Cancer Mother        LUNG     Review of Systems  Constitutional: Negative.  Negative for chills and fever.  HENT:  Negative for congestion, hearing loss and sore throat.   Respiratory: Negative.  Negative for cough and shortness of breath.   Cardiovascular: Negative.  Negative for chest pain and palpitations.  Gastrointestinal:  Positive for abdominal pain and heartburn.  Genitourinary: Negative.  Negative for dysuria and hematuria.  Skin: Negative.  Negative for rash.  Neurological: Negative.  Negative for dizziness and headaches.  All other systems reviewed and are negative.   Vitals:   09/21/23 1522  BP: 114/78  Pulse: 68  Temp: 98.2 F (36.8 C)  SpO2: 96%    Physical Exam Vitals reviewed.  Constitutional:      Appearance: He is well-developed.  HENT:     Head: Normocephalic.  Eyes:     Extraocular Movements: Extraocular movements intact.  Cardiovascular:     Rate and Rhythm: Normal rate and regular rhythm.     Pulses: Normal pulses.     Heart sounds: Normal heart sounds.  Pulmonary:     Effort: Pulmonary effort is normal.     Breath sounds: Normal breath sounds.  Abdominal:     Palpations: Abdomen is soft.     Tenderness: There is no abdominal tenderness.  Skin:    General: Skin is warm and dry.     Capillary Refill: Capillary refill takes less than 2 seconds.  Neurological:     General: No focal deficit present.      Mental Status: He is alert and oriented to person, place, and time.  Psychiatric:        Mood and Affect: Mood normal.        Behavior: Behavior normal.      ASSESSMENT & PLAN: A total of 43 minutes was spent with the patient and counseling/coordination of care regarding preparing for this visit, review of most recent office visit notes, review of multiple chronic medical conditions and their management, review of all medications, review of most recent bloodwork results, review of health maintenance items, education on nutrition, prognosis, documentation, and need for follow up.   Problem List  Items Addressed This Visit       Other   Dyslipidemia - Primary    Chronic stable condition Not taking medication at present time Lipid profile done today Diet and nutrition discussed Benefits of exercise discussed      Relevant Orders   CBC with Differential/Platelet   Comprehensive metabolic panel   Hemoglobin A1c   Lipid panel   VITAMIN D 25 Hydroxy (Vit-D Deficiency, Fractures)   Vitamin B12   Prediabetes    Chronic stable condition Blood work done today including hemoglobin A1c Cardiovascular risks associated with prediabetes and dyslipidemia discussed Diet and nutrition discussed Benefits of exercise discussed      Relevant Orders   CBC with Differential/Platelet   Comprehensive metabolic panel   Hemoglobin A1c   Lipid panel   VITAMIN D 25 Hydroxy (Vit-D Deficiency, Fractures)   Vitamin B12   Patient Instructions  Health Maintenance, Male Adopting a healthy lifestyle and getting preventive care are important in promoting health and wellness. Ask your health care provider about: The right schedule for you to have regular tests and exams. Things you can do on your own to prevent diseases and keep yourself healthy. What should I know about diet, weight, and exercise? Eat a healthy diet  Eat a diet that includes plenty of vegetables, fruits, low-fat dairy products, and  lean protein. Do not eat a lot of foods that are high in solid fats, added sugars, or sodium. Maintain a healthy weight Body mass index (BMI) is a measurement that can be used to identify possible weight problems. It estimates body fat based on height and weight. Your health care provider can help determine your BMI and help you achieve or maintain a healthy weight. Get regular exercise Get regular exercise. This is one of the most important things you can do for your health. Most adults should: Exercise for at least 150 minutes each week. The exercise should increase your heart rate and make you sweat (moderate-intensity exercise). Do strengthening exercises at least twice a week. This is in addition to the moderate-intensity exercise. Spend less time sitting. Even light physical activity can be beneficial. Watch cholesterol and blood lipids Have your blood tested for lipids and cholesterol at 40 years of age, then have this test every 5 years. You may need to have your cholesterol levels checked more often if: Your lipid or cholesterol levels are high. You are older than 40 years of age. You are at high risk for heart disease. What should I know about cancer screening? Many types of cancers can be detected early and may often be prevented. Depending on your health history and family history, you may need to have cancer screening at various ages. This may include screening for: Colorectal cancer. Prostate cancer. Skin cancer. Lung cancer. What should I know about heart disease, diabetes, and high blood pressure? Blood pressure and heart disease High blood pressure causes heart disease and increases the risk of stroke. This is more likely to develop in people who have high blood pressure readings or are overweight. Talk with your health care provider about your target blood pressure readings. Have your blood pressure checked: Every 3-5 years if you are 28-53 years of age. Every year if you  are 32 years old or older. If you are between the ages of 64 and 17 and are a current or former smoker, ask your health care provider if you should have a one-time screening for abdominal aortic aneurysm (AAA). Diabetes Have regular diabetes  screenings. This checks your fasting blood sugar level. Have the screening done: Once every three years after age 21 if you are at a normal weight and have a low risk for diabetes. More often and at a younger age if you are overweight or have a high risk for diabetes. What should I know about preventing infection? Hepatitis B If you have a higher risk for hepatitis B, you should be screened for this virus. Talk with your health care provider to find out if you are at risk for hepatitis B infection. Hepatitis C Blood testing is recommended for: Everyone born from [birth year] through 1963. Anyone with known risk factors for hepatitis C. Sexually transmitted infections (STIs) You should be screened each year for STIs, including gonorrhea and chlamydia, if: You are sexually active and are younger than 40 years of age. You are older than 40 years of age and your health care provider tells you that you are at risk for this type of infection. Your sexual activity has changed since you were last screened, and you are at increased risk for chlamydia or gonorrhea. Ask your health care provider if you are at risk. Ask your health care provider about whether you are at high risk for HIV. Your health care provider may recommend a prescription medicine to help prevent HIV infection. If you choose to take medicine to prevent HIV, you should first get tested for HIV. You should then be tested every 3 months for as long as you are taking the medicine. Follow these instructions at home: Alcohol use Do not drink alcohol if your health care provider tells you not to drink. If you drink alcohol: Limit how much you have to 0-2 drinks a day. Know how much alcohol is in your drink. In  the [place] one drink equals one 12 oz bottle of beer (355 mL), one 5 oz glass of wine (148 mL), or one 1 oz glass of hard liquor (44 mL). Lifestyle Do not use any products that contain nicotine or tobacco. These products include cigarettes, chewing tobacco, and vaping devices, such as e-cigarettes. If you need help quitting, ask your health care provider. Do not use street drugs. Do not share needles. Ask your health care provider for help if you need support or information about quitting drugs. General instructions Schedule regular health, dental, and eye exams. Stay current with your vaccines. Tell your health care provider if: You often feel depressed. You have ever been abused or do not feel safe at home. Summary Adopting a healthy lifestyle and getting preventive care are important in promoting health and wellness. Follow your health care provider's instructions about healthy diet, exercising, and getting tested or screened for diseases. Follow your health care provider's instructions on monitoring your cholesterol and blood pressure. This information is not intended to replace advice given to you by your health care provider. Make sure you discuss any questions you have with your health care provider. Document Revised: 02/17/2021 Document Reviewed: 02/17/2021 Elsevier Patient Education  2024 Elsevier Inc.      Edwina Barth, MD [facility]

## 2023-09-21 NOTE — Assessment & Plan Note (Signed)
Chronic stable condition Not taking medication at present time Lipid profile done today Diet and nutrition discussed Benefits of exercise discussed

## 2023-09-21 NOTE — Patient Instructions (Signed)
Health Maintenance, Male Adopting a healthy lifestyle and getting preventive care are important in promoting health and wellness. Ask your health care provider about: The right schedule for you to have regular tests and exams. Things you can do on your own to prevent diseases and keep yourself healthy. What should I know about diet, weight, and exercise? Eat a healthy diet  Eat a diet that includes plenty of vegetables, fruits, low-fat dairy products, and lean protein. Do not eat a lot of foods that are high in solid fats, added sugars, or sodium. Maintain a healthy weight Body mass index (BMI) is a measurement that can be used to identify possible weight problems. It estimates body fat based on height and weight. Your health care provider can help determine your BMI and help you achieve or maintain a healthy weight. Get regular exercise Get regular exercise. This is one of the most important things you can do for your health. Most adults should: Exercise for at least 150 minutes each week. The exercise should increase your heart rate and make you sweat (moderate-intensity exercise). Do strengthening exercises at least twice a week. This is in addition to the moderate-intensity exercise. Spend less time sitting. Even light physical activity can be beneficial. Watch cholesterol and blood lipids Have your blood tested for lipids and cholesterol at 40 years of age, then have this test every 5 years. You may need to have your cholesterol levels checked more often if: Your lipid or cholesterol levels are high. You are older than 40 years of age. You are at high risk for heart disease. What should I know about cancer screening? Many types of cancers can be detected early and may often be prevented. Depending on your health history and family history, you may need to have cancer screening at various ages. This may include screening for: Colorectal cancer. Prostate cancer. Skin cancer. Lung  cancer. What should I know about heart disease, diabetes, and high blood pressure? Blood pressure and heart disease High blood pressure causes heart disease and increases the risk of stroke. This is more likely to develop in people who have high blood pressure readings or are overweight. Talk with your health care provider about your target blood pressure readings. Have your blood pressure checked: Every 3-5 years if you are 18-39 years of age. Every year if you are 40 years old or older. If you are between the ages of 65 and 75 and are a current or former smoker, ask your health care provider if you should have a one-time screening for abdominal aortic aneurysm (AAA). Diabetes Have regular diabetes screenings. This checks your fasting blood sugar level. Have the screening done: Once every three years after age 45 if you are at a normal weight and have a low risk for diabetes. More often and at a younger age if you are overweight or have a high risk for diabetes. What should I know about preventing infection? Hepatitis B If you have a higher risk for hepatitis B, you should be screened for this virus. Talk with your health care provider to find out if you are at risk for hepatitis B infection. Hepatitis C Blood testing is recommended for: Everyone born from 1945 through 1965. Anyone with known risk factors for hepatitis C. Sexually transmitted infections (STIs) You should be screened each year for STIs, including gonorrhea and chlamydia, if: You are sexually active and are younger than 40 years of age. You are older than 40 years of age and your   health care provider tells you that you are at risk for this type of infection. Your sexual activity has changed since you were last screened, and you are at increased risk for chlamydia or gonorrhea. Ask your health care provider if you are at risk. Ask your health care provider about whether you are at high risk for HIV. Your health care provider  may recommend a prescription medicine to help prevent HIV infection. If you choose to take medicine to prevent HIV, you should first get tested for HIV. You should then be tested every 3 months for as long as you are taking the medicine. Follow these instructions at home: Alcohol use Do not drink alcohol if your health care provider tells you not to drink. If you drink alcohol: Limit how much you have to 0-2 drinks a day. Know how much alcohol is in your drink. In the [place] one drink equals one 12 oz bottle of beer (355 mL), one 5 oz glass of wine (148 mL), or one 1 oz glass of hard liquor (44 mL). Lifestyle Do not use any products that contain nicotine or tobacco. These products include cigarettes, chewing tobacco, and vaping devices, such as e-cigarettes. If you need help quitting, ask your health care provider. Do not use street drugs. Do not share needles. Ask your health care provider for help if you need support or information about quitting drugs. General instructions Schedule regular health, dental, and eye exams. Stay current with your vaccines. Tell your health care provider if: You often feel depressed. You have ever been abused or do not feel safe at home. Summary Adopting a healthy lifestyle and getting preventive care are important in promoting health and wellness. Follow your health care provider's instructions about healthy diet, exercising, and getting tested or screened for diseases. Follow your health care provider's instructions on monitoring your cholesterol and blood pressure. This information is not intended to replace advice given to you by your health care provider. Make sure you discuss any questions you have with your health care provider. Document Revised: 02/17/2021 Document Reviewed: 02/17/2021 Elsevier Patient Education  2024 Elsevier Inc.  

## 2023-09-22 ENCOUNTER — Other Ambulatory Visit: Payer: Self-pay | Admitting: Emergency Medicine

## 2023-09-22 DIAGNOSIS — E559 Vitamin D deficiency, unspecified: Secondary | ICD-10-CM

## 2023-09-22 DIAGNOSIS — E785 Hyperlipidemia, unspecified: Secondary | ICD-10-CM

## 2023-09-22 MED ORDER — VITAMIN D (ERGOCALCIFEROL) 1.25 MG (50000 UNIT) PO CAPS
50000.0000 [IU] | ORAL_CAPSULE | ORAL | 1 refills | Status: DC
Start: 2023-09-22 — End: 2024-01-06

## 2023-09-22 MED ORDER — ROSUVASTATIN CALCIUM 20 MG PO TABS
20.0000 mg | ORAL_TABLET | Freq: Every day | ORAL | 3 refills | Status: AC
Start: 2023-09-22 — End: ?

## 2023-11-07 ENCOUNTER — Emergency Department (HOSPITAL_COMMUNITY): Payer: BC Managed Care – PPO

## 2023-11-07 ENCOUNTER — Other Ambulatory Visit: Payer: Self-pay

## 2023-11-07 ENCOUNTER — Encounter (HOSPITAL_COMMUNITY): Payer: Self-pay | Admitting: *Deleted

## 2023-11-07 ENCOUNTER — Emergency Department (HOSPITAL_COMMUNITY)
Admission: EM | Admit: 2023-11-07 | Discharge: 2023-11-08 | Disposition: A | Discharge status: Home / Self Care | Payer: BC Managed Care – PPO

## 2023-11-07 DIAGNOSIS — R002 Palpitations: Secondary | ICD-10-CM | POA: Insufficient documentation

## 2023-11-07 DIAGNOSIS — R0789 Other chest pain: Secondary | ICD-10-CM | POA: Insufficient documentation

## 2023-11-07 DIAGNOSIS — R079 Chest pain, unspecified: Secondary | ICD-10-CM | POA: Diagnosis not present

## 2023-11-07 DIAGNOSIS — R42 Dizziness and giddiness: Secondary | ICD-10-CM | POA: Insufficient documentation

## 2023-11-07 LAB — CBC
HCT: 41.7 % (ref 39.0–52.0)
Hemoglobin: 14 g/dL (ref 13.0–17.0)
MCH: 29.4 pg (ref 26.0–34.0)
MCHC: 33.6 g/dL (ref 30.0–36.0)
MCV: 87.6 fL (ref 80.0–100.0)
Platelets: 267 10*3/uL (ref 150–400)
RBC: 4.76 MIL/uL (ref 4.22–5.81)
RDW: 12.7 % (ref 11.5–15.5)
WBC: 8.2 10*3/uL (ref 4.0–10.5)
nRBC: 0 % (ref 0.0–0.2)

## 2023-11-07 LAB — BASIC METABOLIC PANEL
Anion gap: 10 (ref 5–15)
BUN: 11 mg/dL (ref 6–20)
CO2: 25 mmol/L (ref 22–32)
Calcium: 9.2 mg/dL (ref 8.9–10.3)
Chloride: 106 mmol/L (ref 98–111)
Creatinine, Ser: 1.07 mg/dL (ref 0.61–1.24)
GFR, Estimated: >60 mL/min (ref >60)
Glucose, Bld: 98 mg/dL (ref 70–99)
Potassium: 3.8 mmol/L (ref 3.5–5.1)
Sodium: 141 mmol/L (ref 135–145)

## 2023-11-07 LAB — TROPONIN I (HIGH SENSITIVITY): Troponin I (High Sensitivity): 3 ng/L (ref <18)

## 2023-11-07 NOTE — ED Notes (Signed)
To x-ray

## 2023-11-07 NOTE — ED Triage Notes (Signed)
The pt has been having some lt sided chest pain since Thursday with a few episodes of dizziness no pain at   the  present

## 2023-11-08 LAB — TROPONIN I (HIGH SENSITIVITY): Troponin I (High Sensitivity): 2 ng/L (ref <18)

## 2023-11-08 NOTE — ED Provider Notes (Signed)
[place] EMERGENCY DEPARTMENT AT [facility] Provider Note   CSN: [id number] Arrival date & time: 11/07/23  2222     History  Chief Complaint  Patient presents with   Chest Pain    Carlos Lambert is a 43 y.o. male.  This is a 11-year-old male presenting emergency department for chest pain.  Pain started Thursday/Friday.  Has left-sided, had moved around since then.  Currently retrosternal.  Described as dull.  No shortness of breath.  Low risk for PE based on Wells criteria.  Has had some intermittent lightheadedness and palpitations.  None currently   Chest Pain      Home Medications Prior to Admission medications   Medication Sig Start Date End Date Taking? Authorizing Provider  pantoprazole (PROTONIX) 40 MG tablet Take 1 tablet (40 mg total) by mouth daily. Patient not taking: Reported on 08/11/2023 03/31/22   Corwin Levins, MD  rosuvastatin (CRESTOR) 20 MG tablet Take 1 tablet (20 mg total) by mouth daily. 09/22/23   Georgina Quint, MD  Vitamin D, Ergocalciferol, (DRISDOL) 1.25 MG (50000 UNIT) CAPS capsule Take 1 capsule (50,000 Units total) by mouth every 7 (seven) days. 09/22/23   Georgina Quint, MD      Allergies    Patient has no known allergies.    Review of Systems   Review of Systems  Cardiovascular:  Positive for chest pain.    Physical Exam Updated Vital Signs BP 132/89 (BP Location: Right Arm)   Pulse 64   Temp 97.7 F (36.5 C) (Oral)   Resp 16   Ht 5\' 9"  (1.753 m)   Wt 104.1 kg   SpO2 100%   BMI 33.89 kg/m  Physical Exam Vitals and nursing note reviewed.  Constitutional:      General: He is not in acute distress.    Appearance: He is not toxic-appearing.  Cardiovascular:     Rate and Rhythm: Normal rate and regular rhythm.     Heart sounds: Normal heart sounds.  Pulmonary:     Effort: Pulmonary effort is normal.     Breath sounds: Normal breath sounds.  Abdominal:     Palpations: Abdomen is soft.   Musculoskeletal:     Cervical back: Normal range of motion.     Right lower leg: No edema.     Left lower leg: No edema.  Skin:    General: Skin is warm.     Capillary Refill: Capillary refill takes less than 2 seconds.  Neurological:     Mental Status: He is alert and oriented to person, place, and time.     ED Results / Procedures / Treatments   Labs (all labs ordered are listed, but only abnormal results are displayed) Labs Reviewed  BASIC METABOLIC PANEL  CBC  TROPONIN I (HIGH SENSITIVITY)  TROPONIN I (HIGH SENSITIVITY)    EKG EKG Interpretation Date/Time:  Sunday November 07 2023 22:37:08 EST Ventricular Rate:  77 PR Interval:  150 QRS Duration:  90 QT Interval:  372 QTC Calculation: 420 R Axis:   59  Text Interpretation: Normal sinus rhythm Normal ECG When compared with ECG of 30-Dec-2022 08:26, HEART RATE has increased Confirmed by Dione Booze ([id number]) on 11/08/2023 12:12:37 AM  Radiology DG Chest 2 View Result Date: 11/07/2023 CLINICAL DATA:  Chest pain for several days EXAM: CHEST - 2 VIEW COMPARISON:  09/12/2023 FINDINGS: The heart size and mediastinal contours are within normal limits. Both lungs are clear. The visualized skeletal structures are unremarkable.  IMPRESSION: No active cardiopulmonary disease. Electronically Signed   By: Alcide Clever M.D.   On: 11/07/2023 22:59    Procedures Procedures    Medications Ordered in ED Medications - No data to display  ED Course/ Medical Decision Making/ A&P Clinical Course as of 11/08/23 0855  Mon Nov 08, 2023  4098 Echo 01/17/23 per chart review: " Your echo shows that your heart is strong and without serious valve problems, good news.  " [TY]  0845 CT coronary 12/18/22 :"IMPRESSION: 1. Coronary calcium score of 0. This was 1st percentile for age, sex, and race matched control.   2. Normal coronary origin with right dominance.   3. CAD-RADS 0. No evidence of CAD (0%). Consider non-atherosclerotic causes of  chest pain. Heavy motion artifact through study precludes availability for plaque analysis.   RECOMMENDATIONS: RECOMMENDATIONS The proposed cut-off value of 1,651 AU yielded a 93 % sensitivity and 75 % specificity in grading AS severity in patients with classical low-flow, low-gradient AS. Proposed different cut-off values to define severe AS for men and women as 2,065 AU and 1,274 AU, respectively. The joint European and American recommendations for the assessment of AS consider the aortic valve calcium score as a continuum - a very high calcium score suggests severe AS and a low calcium score suggests severe AS is unlikely. " [TY]  0845 Troponin I (High Sensitivity): 2 Negative x2.  ACS unlikely [TY]  0845 Basic metabolic panel No significant metabolic derangements.  Normal kidney function [TY]  0845 CBC No leukocytosis to suggest systemic infection.  No anemia that would explain his symptoms. [TY]  0853 DG Chest 2 View IMPRESSION: No active cardiopulmonary disease.   [TY]  0854 EKG 12-Lead EKG on my independent interpretation is normal sinus rhythm at a rate of 77 bpm.  Normal intervals.  QTc 420.  Does not appear to have ST segment changes that would indicate ischemia [TY]    Clinical Course User Index [TY] Coral Spikes, DO                                 Medical Decision Making Well-appearing 43 year old male presents emergency department with atypical chest pain.  He is afebrile nontachycardic hemodynamically stable.  Workup reassuring as noted in ED course.  ACS unlikely.  Low risk for PE.  Chest x-ray without pneumonia pneumothorax.  No widened mediastinum.  Equal pulses.  Low suspicion for aortic dissection.  Pain seemingly migratory; suspect MSK etiology.  Currently retrosternal which could be GERD.  He has had recent cardiac workup with echo and CT coronary which were reassuring.  I do not see any life-threatening or emergent causes of his symptoms today.  Feel that he  stable for discharge.  Discussed follow-up with primary doctor  Amount and/or Complexity of Data Reviewed Labs:  Decision-making details documented in ED Course. Radiology:  Decision-making details documented in ED Course. ECG/medicine tests:  Decision-making details documented in ED Course.          Final Clinical Impression(s) / ED Diagnoses Final diagnoses:  None    Rx / DC Orders ED Discharge Orders     None         Coral Spikes, DO 11/08/23 [phone]

## 2023-11-08 NOTE — Discharge Instructions (Signed)
Please follow-up your primary doctor.  Return if develop any fevers, chills, worsening chest pain, shortness of breath, lightheadedness, passout or any new or worsening symptoms that are concerning to you.

## 2023-11-16 ENCOUNTER — Telehealth: Payer: Self-pay

## 2023-11-16 NOTE — Progress Notes (Signed)
 Transition Care Management Follow-up Telephone Call Date of discharge and from where: 11/08/2023 [facility] How have you been since you were released from the hospital? Patient stated he is feeling better, no further episodes of chest pain. Any questions or concerns? No  Items Reviewed: Did the pt receive and understand the discharge instructions provided? Yes  Medications obtained and verified?  No medication prescribed Other? No  Any new allergies since your discharge? No  Dietary orders reviewed? Yes Do you have support at home? Yes   Follow up appointments reviewed:  PCP Hospital f/u appt confirmed? No  Scheduled to see  on  @ . Specialist Hospital f/u appt confirmed? No  Scheduled to see  on  @ . Are transportation arrangements needed? No  If their condition worsens, is the pt aware to call PCP or go to the Emergency Dept.? Yes Was the patient provided with contact information for the PCP's office or ED? Yes Was to pt encouraged to call back with questions or concerns? Yes   Jj Enyeart [facility]  [facility] Guide Direct Dial: [phone]  Fax: [phone] Website: [email or web address]

## 2024-01-06 ENCOUNTER — Other Ambulatory Visit: Payer: Self-pay | Admitting: Emergency Medicine

## 2024-01-06 DIAGNOSIS — E559 Vitamin D deficiency, unspecified: Secondary | ICD-10-CM

## 2024-02-26 ENCOUNTER — Ambulatory Visit
Admission: RE | Admit: 2024-02-26 | Discharge: 2024-02-26 | Disposition: A | Discharge status: Home / Self Care | Source: Ambulatory Visit | Attending: Family Medicine | Admitting: Family Medicine

## 2024-02-26 ENCOUNTER — Other Ambulatory Visit: Payer: Self-pay

## 2024-02-26 VITALS — BP 111/76 | HR 60 | Temp 98.2°F | Resp 18 | Ht 69.0 in | Wt 225.0 lb

## 2024-02-26 DIAGNOSIS — K219 Gastro-esophageal reflux disease without esophagitis: Secondary | ICD-10-CM | POA: Diagnosis not present

## 2024-02-26 DIAGNOSIS — R1013 Epigastric pain: Secondary | ICD-10-CM

## 2024-02-26 MED ORDER — PANTOPRAZOLE SODIUM 40 MG PO TBEC
40.0000 mg | DELAYED_RELEASE_TABLET | Freq: Every day | ORAL | 0 refills | Status: DC
Start: 2024-02-26 — End: 2024-08-08

## 2024-02-26 NOTE — ED Provider Notes (Signed)
 [facility]    CSN: [id number] Arrival date & time: 02/26/24  0826      History   Chief Complaint Chief Complaint  Patient presents with   Abdominal Pain    Stomach discomfort - Entered by patient    HPI Carlos Lambert is a 43 y.o. male.   HPI Pleasant 43 year old male presents with upper abdominal pain for 1 week that is constant and ranges throughout the day.  Patient reports burning sensation PMH significant for chest pain, obesity, GERD, and hypercholesterolemia.  Past Medical History:  Diagnosis Date   Chest pain    GERD (gastroesophageal reflux disease)    Hypercholesteremia    Pain in leg, unspecified     Patient Active Problem List   Diagnosis Date Noted   Vitamin D  deficiency 04/04/2022   Encounter for well adult exam with abnormal findings 03/31/2022   GERD (gastroesophageal reflux disease) 03/31/2022   Dyslipidemia 03/05/2020   Prediabetes 03/05/2020   Elevated triglycerides with high cholesterol 04/02/2018   History of prediabetes 04/02/2018    Past Surgical History:  Procedure Laterality Date   KNEE SURGERY         Home Medications    Prior to Admission medications   Medication Sig Start Date End Date Taking? Authorizing Provider  pantoprazole  (PROTONIX ) 40 MG tablet Take 1 tablet (40 mg total) by mouth daily. 02/26/24   Leonides Ramp, FNP  rosuvastatin  (CRESTOR ) 20 MG tablet Take 1 tablet (20 mg total) by mouth daily. 09/22/23   Elvira Hammersmith, MD  Vitamin D , Ergocalciferol , (DRISDOL ) 1.25 MG (50000 UNIT) CAPS capsule TAKE 1 CAPSULE BY MOUTH EVERY 7 DAYS 01/06/24   Elvira Hammersmith, MD    Family History Family History  Problem Relation Age of Onset   Hypertension Father    Hyperlipidemia Father    Cancer Mother        LUNG    Social History Social History   Tobacco Use   Smoking status: Former   Smokeless tobacco: Never  Vaping Use   Vaping status: Never Used  Substance Use Topics   Alcohol use: Not  Currently   Drug use: No     Allergies   Patient has no known allergies.   Review of Systems Review of Systems  Gastrointestinal:  Positive for abdominal pain.       Upper abdominal pain with reflux x 1 week  All other systems reviewed and are negative.    Physical Exam Triage Vital Signs ED Triage Vitals  Encounter Vitals Group     BP 02/26/24 0840 111/76     Systolic BP Percentile --      Diastolic BP Percentile --      Pulse Rate 02/26/24 0840 60     Resp 02/26/24 0840 18     Temp 02/26/24 0840 98.2 F (36.8 C)     Temp Source 02/26/24 0840 Oral     SpO2 02/26/24 0840 97 %     Weight 02/26/24 0838 225 lb (102.1 kg)     Height 02/26/24 0838 5\' 9"  (1.753 m)     Head Circumference --      Peak Flow --      Pain Score 02/26/24 0838 1     Pain Loc --      Pain Education --      Exclude from Growth Chart --    No data found.  Updated Vital Signs BP 111/76 (BP Location: Right Arm)   Pulse 60  Temp 98.2 F (36.8 C) (Oral)   Resp 18   Ht 5\' 9"  (1.753 m)   Wt 225 lb (102.1 kg)   SpO2 97%   BMI 33.23 kg/m    Physical Exam Vitals and nursing note reviewed.  Constitutional:      Appearance: He is well-developed. He is obese.  HENT:     Head: Normocephalic and atraumatic.     Mouth/Throat:     Mouth: Mucous membranes are moist.     Pharynx: Oropharynx is clear.  Eyes:     Extraocular Movements: Extraocular movements intact.     Pupils: Pupils are equal, round, and reactive to light.  Cardiovascular:     Rate and Rhythm: Normal rate and regular rhythm.     Heart sounds: Normal heart sounds.  Pulmonary:     Effort: Pulmonary effort is normal.     Breath sounds: Normal breath sounds. No wheezing, rhonchi or rales.  Chest:     Chest wall: No tenderness.  Abdominal:     General: Abdomen is flat. There is no abdominal bruit.     Palpations: Abdomen is soft. There is no shifting dullness, fluid wave, hepatomegaly, splenomegaly, mass or pulsatile mass.   Skin:    General: Skin is warm and dry.  Neurological:     General: No focal deficit present.     Mental Status: He is alert.  Psychiatric:        Mood and Affect: Mood normal.        Behavior: Behavior normal.      UC Treatments / Results  Labs (all labs ordered are listed, but only abnormal results are displayed) Labs Reviewed - No data to display  EKG   Radiology No results found.  Procedures Procedures (including critical care time)  Medications Ordered in UC Medications - No data to display  Initial Impression / Assessment and Plan / UC Course  I have reviewed the triage vital signs and the nursing notes.  Pertinent labs & imaging results that were available during my care of the patient were reviewed by me and considered in my medical decision making (see chart for details).     MDM: 1.  Gastroesophageal reflux disease, unspecified whether esophagitis present-Rx'd Protonix  40 mg tablet: Take 1 tablet daily x 30 days. Advised patient to take medication as directed with 8 to 12 ounces of water first thing in the morning and no other foods or beverages for 45 minutes so that this medication can take effect.  Encouraged to increase daily water intake to 64 ounces per day while taking this medication.  Advised if symptoms worsen and/or unresolved please follow-up with your PCP or here for further evaluation.  Final Clinical Impressions(s) / UC Diagnoses   Final diagnoses:  Gastroesophageal reflux disease, unspecified whether esophagitis present     Discharge Instructions      Advised patient to take medication as directed with 8 to 12 ounces of water first thing in the morning and no other foods or beverages for 45 minutes so that this medication can take effect.  Encouraged to increase daily water intake to 64 ounces per day while taking this medication.  Advised if symptoms worsen and/or unresolved please follow-up with your PCP or here for further  evaluation.   ED Prescriptions     Medication Sig Dispense Auth. Provider   pantoprazole  (PROTONIX ) 40 MG tablet Take 1 tablet (40 mg total) by mouth daily. 30 tablet Quamel Fitzmaurice, FNP  PDMP not reviewed this encounter.   Leonides Ramp, FNP 02/26/24 [phone]

## 2024-02-26 NOTE — Discharge Instructions (Addendum)
 Advised patient to take medication as directed with 8 to 12 ounces of water first thing in the morning and no other foods or beverages for 45 minutes so that this medication can take effect.  Encouraged to increase daily water intake to 64 ounces per day while taking this medication.  Advised if symptoms worsen and/or unresolved please follow-up with your PCP or here for further evaluation.

## 2024-02-26 NOTE — ED Triage Notes (Signed)
 Patient states upper abdominal pain for about a week that is pretty constant but ranges in intensity throughout the day, burning sensation.  Tried pepto and alka seltzer without much relief

## 2024-04-06 ENCOUNTER — Ambulatory Visit (INDEPENDENT_AMBULATORY_CARE_PROVIDER_SITE_OTHER): Admitting: Emergency Medicine

## 2024-04-06 ENCOUNTER — Ambulatory Visit: Payer: Self-pay | Admitting: Emergency Medicine

## 2024-04-06 VITALS — BP 124/72 | HR 65 | Temp 98.1°F | Ht 69.0 in | Wt 220.0 lb

## 2024-04-06 DIAGNOSIS — Z1329 Encounter for screening for other suspected endocrine disorder: Secondary | ICD-10-CM

## 2024-04-06 DIAGNOSIS — Z0000 Encounter for general adult medical examination without abnormal findings: Secondary | ICD-10-CM | POA: Diagnosis not present

## 2024-04-06 DIAGNOSIS — E785 Hyperlipidemia, unspecified: Secondary | ICD-10-CM

## 2024-04-06 DIAGNOSIS — Z130 Encounter for screening for diseases of the blood and blood-forming organs and certain disorders involving the immune mechanism: Secondary | ICD-10-CM

## 2024-04-06 DIAGNOSIS — Z13220 Encounter for screening for lipoid disorders: Secondary | ICD-10-CM

## 2024-04-06 DIAGNOSIS — Z125 Encounter for screening for malignant neoplasm of prostate: Secondary | ICD-10-CM | POA: Diagnosis not present

## 2024-04-06 DIAGNOSIS — Z13228 Encounter for screening for other metabolic disorders: Secondary | ICD-10-CM

## 2024-04-06 LAB — HEMOGLOBIN A1C: Hgb A1c MFr Bld: 5.9 % (ref 4.6–6.5)

## 2024-04-06 LAB — CBC WITH DIFFERENTIAL/PLATELET
Basophils Absolute: 0 10*3/uL (ref 0.0–0.1)
Basophils Relative: 0.6 % (ref 0.0–3.0)
Eosinophils Absolute: 0.1 10*3/uL (ref 0.0–0.7)
Eosinophils Relative: 1.5 % (ref 0.0–5.0)
HCT: 42.3 % (ref 39.0–52.0)
Hemoglobin: 14.3 g/dL (ref 13.0–17.0)
Lymphocytes Relative: 26.6 % (ref 12.0–46.0)
Lymphs Abs: 1.6 10*3/uL (ref 0.7–4.0)
MCHC: 33.7 g/dL (ref 30.0–36.0)
MCV: 85.4 fl (ref 78.0–100.0)
Monocytes Absolute: 0.5 10*3/uL (ref 0.1–1.0)
Monocytes Relative: 9.3 % (ref 3.0–12.0)
Neutro Abs: 3.6 10*3/uL (ref 1.4–7.7)
Neutrophils Relative %: 62 % (ref 43.0–77.0)
Platelets: 228 10*3/uL (ref 150.0–400.0)
RBC: 4.96 Mil/uL (ref 4.22–5.81)
RDW: 13.6 % (ref 11.5–15.5)
WBC: 5.8 10*3/uL (ref 4.0–10.5)

## 2024-04-06 LAB — COMPREHENSIVE METABOLIC PANEL WITH GFR
ALT: 37 U/L (ref 0–53)
AST: 22 U/L (ref 0–37)
Albumin: 4.5 g/dL (ref 3.5–5.2)
Alkaline Phosphatase: 61 U/L (ref 39–117)
BUN: 13 mg/dL (ref 6–23)
CO2: 28 meq/L (ref 19–32)
Calcium: 9.2 mg/dL (ref 8.4–10.5)
Chloride: 104 meq/L (ref 96–112)
Creatinine, Ser: 0.96 mg/dL (ref 0.40–1.50)
GFR: 98.32 mL/min (ref >60.00)
Glucose, Bld: 109 mg/dL — ABNORMAL HIGH (ref 70–99)
Potassium: 4.1 meq/L (ref 3.5–5.1)
Sodium: 139 meq/L (ref 135–145)
Total Bilirubin: 0.6 mg/dL (ref 0.2–1.2)
Total Protein: 6.8 g/dL (ref 6.0–8.3)

## 2024-04-06 LAB — LIPID PANEL
Cholesterol: 227 mg/dL — ABNORMAL HIGH (ref 0–200)
HDL: 29.4 mg/dL — ABNORMAL LOW (ref >39.00)
LDL Cholesterol: 119 mg/dL — ABNORMAL HIGH (ref 0–99)
NonHDL: 197.13
Total CHOL/HDL Ratio: 8
Triglycerides: 392 mg/dL — ABNORMAL HIGH (ref 0.0–149.0)
VLDL: 78.4 mg/dL — ABNORMAL HIGH (ref 0.0–40.0)

## 2024-04-06 LAB — PSA: PSA: 0.89 ng/mL (ref 0.10–4.00)

## 2024-04-06 LAB — TSH: TSH: 1.55 u[IU]/mL (ref 0.35–5.50)

## 2024-04-06 LAB — VITAMIN B12: Vitamin B-12: 549 pg/mL (ref 211–911)

## 2024-04-06 NOTE — Progress Notes (Signed)
 Call the lab and ask them to also run the vitamin D  level please.  Thanks.  Order was placed.

## 2024-04-06 NOTE — Progress Notes (Signed)
 Carlos Lambert 43 y.o.   Chief Complaint  Patient presents with   Annual Exam    Patient here for physical. No other concerns     HISTORY OF PRESENT ILLNESS: This is a 43 y.o. male here for annual exam. Overall doing well.  Has no complaints or medical concerns today.  HPI   Prior to Admission medications   Medication Sig Start Date End Date Taking? Authorizing Provider  rosuvastatin  (CRESTOR ) 20 MG tablet Take 1 tablet (20 mg total) by mouth daily. 09/22/23  Yes Wretha Laris, Emil Schanz, MD  pantoprazole  (PROTONIX ) 40 MG tablet Take 1 tablet (40 mg total) by mouth daily. Patient not taking: Reported on 04/06/2024 02/26/24   Teddy Sharper, FNP  Vitamin D , Ergocalciferol , (DRISDOL ) 1.25 MG (50000 UNIT) CAPS capsule TAKE 1 CAPSULE BY MOUTH EVERY 7 DAYS Patient not taking: Reported on 04/06/2024 01/06/24   Purcell Emil Schanz, MD    No Known Allergies  Patient Active Problem List   Diagnosis Date Noted   Vitamin D  deficiency 04/04/2022   Encounter for well adult exam with abnormal findings 03/31/2022   GERD (gastroesophageal reflux disease) 03/31/2022   Dyslipidemia 03/05/2020   Prediabetes 03/05/2020   Elevated triglycerides with high cholesterol 04/02/2018   History of prediabetes 04/02/2018    Past Medical History:  Diagnosis Date   Chest pain    GERD (gastroesophageal reflux disease)    Hypercholesteremia    Pain in leg, unspecified     Past Surgical History:  Procedure Laterality Date   KNEE SURGERY      Social History   Socioeconomic History   Marital status: Divorced    Spouse name: Not on file   Number of children: 1   Years of education: Not on file   Highest education level: Some college, no degree  Occupational History   Not on file  Tobacco Use   Smoking status: Former   Smokeless tobacco: Never  Vaping Use   Vaping status: Never Used  Substance and Sexual Activity   Alcohol use: Not Currently   Drug use: No   Sexual activity: Not on file   Other Topics Concern   Not on file  Social History Narrative    Family [place] and ancestors from [place]   Social Drivers of Health   Financial Resource Strain: Low Risk  (04/06/2024)   Overall Financial Resource Strain (CARDIA)    Difficulty of Paying Living Expenses: Not very hard  Food Insecurity: No Food Insecurity (04/06/2024)   Hunger Vital Sign    Worried About Running Out of Food in the Last Year: Never true    Ran Out of Food in the Last Year: Never true  Transportation Needs: No Transportation Needs (04/06/2024)   PRAPARE - Administrator, Civil Service (Medical): No    Lack of Transportation (Non-Medical): No  Physical Activity: Insufficiently Active (04/06/2024)   Exercise Vital Sign    Days of Exercise per Week: 3 days    Minutes of Exercise per Session: 30 min  Stress: No Stress Concern Present (04/06/2024)   [employer] of Occupational Health - Occupational Stress Questionnaire    Feeling of Stress: Only a little  Social Connections: Moderately Integrated (04/06/2024)   Social Connection and Isolation Panel    Frequency of Communication with Friends and Family: More than three times a week    Frequency of Social Gatherings with Friends and Family: Once a week    Attends Religious Services: More than 4  times per year    Active Member of Clubs or Organizations: Yes    Attends Banker Meetings: 1 to 4 times per year    Marital Status: Divorced  Catering manager Violence: Not on file    Family History  Problem Relation Age of Onset   Hypertension Father    Hyperlipidemia Father    Cancer Mother        LUNG     Review of Systems  Constitutional: Negative.  Negative for chills and fever.  HENT: Negative.  Negative for congestion and sore throat.   Respiratory: Negative.  Negative for cough and shortness of breath.   Cardiovascular: Negative.  Negative for chest pain and palpitations.  Gastrointestinal:   Negative for abdominal pain, diarrhea, nausea and vomiting.  Genitourinary: Negative.  Negative for dysuria and hematuria.  Skin: Negative.  Negative for rash.  Neurological: Negative.  Negative for dizziness and headaches.  All other systems reviewed and are negative.   Vitals:   04/06/24 0831  BP: 124/72  Pulse: 65  Temp: 98.1 F (36.7 C)  SpO2: 97%    Physical Exam Vitals reviewed.  Constitutional:      Appearance: Normal appearance.  HENT:     Head: Normocephalic.     Right Ear: Tympanic membrane, ear canal and external ear normal.     Left Ear: Tympanic membrane, ear canal and external ear normal.     Mouth/Throat:     Mouth: Mucous membranes are moist.     Pharynx: Oropharynx is clear.   Eyes:     Extraocular Movements: Extraocular movements intact.     Conjunctiva/sclera: Conjunctivae normal.     Pupils: Pupils are equal, round, and reactive to light.    Cardiovascular:     Rate and Rhythm: Normal rate and regular rhythm.     Pulses: Normal pulses.     Heart sounds: Normal heart sounds.  Pulmonary:     Effort: Pulmonary effort is normal.     Breath sounds: Normal breath sounds.   Musculoskeletal:        General: Normal range of motion.     Cervical back: No tenderness.  Lymphadenopathy:     Cervical: No cervical adenopathy.   Skin:    General: Skin is warm and dry.     Capillary Refill: Capillary refill takes less than 2 seconds.   Neurological:     General: No focal deficit present.     Mental Status: He is alert and oriented to person, place, and time.   Psychiatric:        Mood and Affect: Mood normal.        Behavior: Behavior normal.      ASSESSMENT & PLAN: Problem List Items Addressed This Visit   None Visit Diagnoses       Routine general medical examination at a health care facility    -  Primary   Relevant Orders   Vitamin B12   PSA   TSH   Comprehensive metabolic panel with GFR   CBC with Differential/Platelet   Hemoglobin  A1c   Lipid panel     Screening for deficiency anemia       Relevant Orders   CBC with Differential/Platelet     Screening for lipoid disorders       Relevant Orders   Lipid panel     Screening for endocrine, metabolic and immunity disorder       Relevant Orders   Vitamin B12  TSH   Comprehensive metabolic panel with GFR   Hemoglobin A1c     Screening for prostate cancer       Relevant Orders   PSA     Modifiable risk factors discussed with patient. Anticipatory guidance according to age provided. The following topics were also discussed: Social Determinants of Health Smoking.  Non-smoker Diet and nutrition Benefits of exercise Cancer family history reviewed.  History of lung cancer with mother from smoking Vaccinations review and recommendations Cardiovascular risk assessment and need for blood work The 10-year ASCVD risk score (Arnett DK, et al., 2019) is: 4.2%   Values used to calculate the score:     Age: 82 years     Clincally relevant sex: Male     Is Non-Hispanic African American: No     Diabetic: No     Tobacco smoker: No     Systolic Blood Pressure: 124 mmHg     Is BP treated: No     HDL Cholesterol: 30.6 mg/dL     Total Cholesterol: 269 mg/dL  Mental health including depression and anxiety Fall and accident prevention  Patient Instructions  Health Maintenance, Male Adopting a healthy lifestyle and getting preventive care are important in promoting health and wellness. Ask your health care provider about: The right schedule for you to have regular tests and exams. Things you can do on your own to prevent diseases and keep yourself healthy. What should I know about diet, weight, and exercise? Eat a healthy diet  Eat a diet that includes plenty of vegetables, fruits, low-fat dairy products, and lean protein. Do not eat a lot of foods that are high in solid fats, added sugars, or sodium. Maintain a healthy weight Body mass index (BMI) is a measurement  that can be used to identify possible weight problems. It estimates body fat based on height and weight. Your health care provider can help determine your BMI and help you achieve or maintain a healthy weight. Get regular exercise Get regular exercise. This is one of the most important things you can do for your health. Most adults should: Exercise for at least 150 minutes each week. The exercise should increase your heart rate and make you sweat (moderate-intensity exercise). Do strengthening exercises at least twice a week. This is in addition to the moderate-intensity exercise. Spend less time sitting. Even light physical activity can be beneficial. Watch cholesterol and blood lipids Have your blood tested for lipids and cholesterol at 41 years of age, then have this test every 5 years. You may need to have your cholesterol levels checked more often if: Your lipid or cholesterol levels are high. You are older than 41 years of age. You are at high risk for heart disease. What should I know about cancer screening? Many types of cancers can be detected early and may often be prevented. Depending on your health history and family history, you may need to have cancer screening at various ages. This may include screening for: Colorectal cancer. Prostate cancer. Skin cancer. Lung cancer. What should I know about heart disease, diabetes, and high blood pressure? Blood pressure and heart disease High blood pressure causes heart disease and increases the risk of stroke. This is more likely to develop in people who have high blood pressure readings or are overweight. Talk with your health care provider about your target blood pressure readings. Have your blood pressure checked: Every 3-5 years if you are 17-37 years of age. Every year if you are 90  years old or older. If you are between the ages of 43 and 83 and are a current or former smoker, ask your health care provider if you should have a  one-time screening for abdominal aortic aneurysm (AAA). Diabetes Have regular diabetes screenings. This checks your fasting blood sugar level. Have the screening done: Once every three years after age 52 if you are at a normal weight and have a low risk for diabetes. More often and at a younger age if you are overweight or have a high risk for diabetes. What should I know about preventing infection? Hepatitis B If you have a higher risk for hepatitis B, you should be screened for this virus. Talk with your health care provider to find out if you are at risk for hepatitis B infection. Hepatitis C Blood testing is recommended for: Everyone born from [birth year] through 1963. Anyone with known risk factors for hepatitis C. Sexually transmitted infections (STIs) You should be screened each year for STIs, including gonorrhea and chlamydia, if: You are sexually active and are younger than 41 years of age. You are older than 41 years of age and your health care provider tells you that you are at risk for this type of infection. Your sexual activity has changed since you were last screened, and you are at increased risk for chlamydia or gonorrhea. Ask your health care provider if you are at risk. Ask your health care provider about whether you are at high risk for HIV. Your health care provider may recommend a prescription medicine to help prevent HIV infection. If you choose to take medicine to prevent HIV, you should first get tested for HIV. You should then be tested every 3 months for as long as you are taking the medicine. Follow these instructions at home: Alcohol use Do not drink alcohol if your health care provider tells you not to drink. If you drink alcohol: Limit how much you have to 0-2 drinks a day. Know how much alcohol is in your drink. In the [place] one drink equals one 12 oz bottle of beer (355 mL), one 5 oz glass of wine (148 mL), or one 1 oz glass of hard liquor (44 mL). Lifestyle Do not  use any products that contain nicotine or tobacco. These products include cigarettes, chewing tobacco, and vaping devices, such as e-cigarettes. If you need help quitting, ask your health care provider. Do not use street drugs. Do not share needles. Ask your health care provider for help if you need support or information about quitting drugs. General instructions Schedule regular health, dental, and eye exams. Stay current with your vaccines. Tell your health care provider if: You often feel depressed. You have ever been abused or do not feel safe at home. Summary Adopting a healthy lifestyle and getting preventive care are important in promoting health and wellness. Follow your health care provider's instructions about healthy diet, exercising, and getting tested or screened for diseases. Follow your health care provider's instructions on monitoring your cholesterol and blood pressure. This information is not intended to replace advice given to you by your health care provider. Make sure you discuss any questions you have with your health care provider. Document Revised: 02/17/2021 Document Reviewed: 02/17/2021 Elsevier Patient Education  2024 Elsevier Inc.     Emil Schaumann, MD [facility]

## 2024-04-06 NOTE — Patient Instructions (Signed)
 Health Maintenance, Male  Adopting a healthy lifestyle and getting preventive care are important in promoting health and wellness. Ask your health care provider about:  The right schedule for you to have regular tests and exams.  Things you can do on your own to prevent diseases and keep yourself healthy.  What should I know about diet, weight, and exercise?  Eat a healthy diet    Eat a diet that includes plenty of vegetables, fruits, low-fat dairy products, and lean protein.  Do not eat a lot of foods that are high in solid fats, added sugars, or sodium.  Maintain a healthy weight  Body mass index (BMI) is a measurement that can be used to identify possible weight problems. It estimates body fat based on height and weight. Your health care provider can help determine your BMI and help you achieve or maintain a healthy weight.  Get regular exercise  Get regular exercise. This is one of the most important things you can do for your health. Most adults should:  Exercise for at least 150 minutes each week. The exercise should increase your heart rate and make you sweat (moderate-intensity exercise).  Do strengthening exercises at least twice a week. This is in addition to the moderate-intensity exercise.  Spend less time sitting. Even light physical activity can be beneficial.  Watch cholesterol and blood lipids  Have your blood tested for lipids and cholesterol at 41 years of age, then have this test every 5 years.  You may need to have your cholesterol levels checked more often if:  Your lipid or cholesterol levels are high.  You are older than 41 years of age.  You are at high risk for heart disease.  What should I know about cancer screening?  Many types of cancers can be detected early and may often be prevented. Depending on your health history and family history, you may need to have cancer screening at various ages. This may include screening for:  Colorectal cancer.  Prostate cancer.  Skin cancer.  Lung  cancer.  What should I know about heart disease, diabetes, and high blood pressure?  Blood pressure and heart disease  High blood pressure causes heart disease and increases the risk of stroke. This is more likely to develop in people who have high blood pressure readings or are overweight.  Talk with your health care provider about your target blood pressure readings.  Have your blood pressure checked:  Every 3-5 years if you are 9-95 years of age.  Every year if you are 85 years old or older.  If you are between the ages of 29 and 29 and are a current or former smoker, ask your health care provider if you should have a one-time screening for abdominal aortic aneurysm (AAA).  Diabetes  Have regular diabetes screenings. This checks your fasting blood sugar level. Have the screening done:  Once every three years after age 23 if you are at a normal weight and have a low risk for diabetes.  More often and at a younger age if you are overweight or have a high risk for diabetes.  What should I know about preventing infection?  Hepatitis B  If you have a higher risk for hepatitis B, you should be screened for this virus. Talk with your health care provider to find out if you are at risk for hepatitis B infection.  Hepatitis C  Blood testing is recommended for:  Everyone born from [birth year] through 1963.  Anyone  with known risk factors for hepatitis C.  Sexually transmitted infections (STIs)  You should be screened each year for STIs, including gonorrhea and chlamydia, if:  You are sexually active and are younger than 41 years of age.  You are older than 41 years of age and your health care provider tells you that you are at risk for this type of infection.  Your sexual activity has changed since you were last screened, and you are at increased risk for chlamydia or gonorrhea. Ask your health care provider if you are at risk.  Ask your health care provider about whether you are at high risk for HIV. Your health care provider  may recommend a prescription medicine to help prevent HIV infection. If you choose to take medicine to prevent HIV, you should first get tested for HIV. You should then be tested every 3 months for as long as you are taking the medicine.  Follow these instructions at home:  Alcohol use  Do not drink alcohol if your health care provider tells you not to drink.  If you drink alcohol:  Limit how much you have to 0-2 drinks a day.  Know how much alcohol is in your drink. In the [place] one drink equals one 12 oz bottle of beer (355 mL), one 5 oz glass of wine (148 mL), or one 1 oz glass of hard liquor (44 mL).  Lifestyle  Do not use any products that contain nicotine or tobacco. These products include cigarettes, chewing tobacco, and vaping devices, such as e-cigarettes. If you need help quitting, ask your health care provider.  Do not use street drugs.  Do not share needles.  Ask your health care provider for help if you need support or information about quitting drugs.  General instructions  Schedule regular health, dental, and eye exams.  Stay current with your vaccines.  Tell your health care provider if:  You often feel depressed.  You have ever been abused or do not feel safe at home.  Summary  Adopting a healthy lifestyle and getting preventive care are important in promoting health and wellness.  Follow your health care provider's instructions about healthy diet, exercising, and getting tested or screened for diseases.  Follow your health care provider's instructions on monitoring your cholesterol and blood pressure.  This information is not intended to replace advice given to you by your health care provider. Make sure you discuss any questions you have with your health care provider.  Document Revised: 02/17/2021 Document Reviewed: 02/17/2021  Elsevier Patient Education  2024 [employer].

## 2024-04-13 ENCOUNTER — Ambulatory Visit

## 2024-04-17 ENCOUNTER — Telehealth: Payer: Self-pay | Admitting: *Deleted

## 2024-04-17 NOTE — Progress Notes (Unsigned)
 Care Guide Pharmacy Note  04/17/2024 Name: Carlos Lambert MRN: [id number] DOB: 01/28/1981  Referred By: Purcell Emil Schanz, MD Reason for referral: Complex Care Management (Outreach to schedule referral with pharmacist ) and Call Attempt #1   Carlos Lambert is a 43 y.o. year old male who is a primary care patient of Sagardia, Emil Schanz, MD.  Carlos Lambert was referred to the pharmacist for assistance related to: HLD  An unsuccessful telephone outreach was attempted today to contact the patient who was referred to the pharmacy team for assistance with medication management. Additional attempts will be made to contact the patient.  Carlos Lambert, CMA [place]  [facility], [facility] Guide Direct Dial: [phone]  Fax: [phone] Website: [email or web address]

## 2024-04-18 NOTE — Progress Notes (Signed)
 Care Guide Pharmacy Note  04/18/2024 Name: Carlos Lambert MRN: [id number] DOB: 01-24-1981  Referred By: Carlos Carlos Schanz, MD Reason for referral: Complex Care Management (Outreach to schedule referral with pharmacist ) and Call Attempt #1   Carlos Lambert is a 43 y.o. year old male who is a primary care patient of Sagardia, Carlos Schanz, MD.  Carlos Lambert was referred to the pharmacist for assistance related to: HLD  Successful contact was made with the patient to discuss pharmacy services including being ready for the pharmacist to call at least 5 minutes before the scheduled appointment time and to have medication bottles and any blood pressure readings ready for review. The patient agreed to meet with the pharmacist via telephone visit on 04/28/2024  Carlos Lambert, CMA [place]  [facility], [facility] Guide Direct Dial: [phone]  Fax: [phone] Website: [email or web address]

## 2024-04-28 ENCOUNTER — Other Ambulatory Visit: Admitting: Pharmacist

## 2024-04-28 DIAGNOSIS — E785 Hyperlipidemia, unspecified: Secondary | ICD-10-CM

## 2024-04-28 NOTE — Progress Notes (Signed)
 04/28/2024 Name: Carlos Lambert MRN: [id number] DOB: [birth year]  Chief Complaint  Patient presents with   Hyperlipidemia   Medication Management    Carlos Lambert is a 43 y.o. year old male who presented for a telephone visit.   They were referred to the pharmacist by their PCP for assistance in managing hyperlipidemia.   Subjective:  Care Team: Primary Care Provider: Purcell Emil Schanz, MD ; Next Scheduled Visit: 04/09/2025  Medication Access/Adherence  Current Pharmacy:  [id number] [place], [place] - [address] AT [facility] OF [facility] [address] [place] [address] Phone: [phone] Fax: [phone]   Patient reports affordability concerns with their medications: No  Patient reports access/transportation concerns to their pharmacy: No  Patient reports adherence concerns with their medications:  Yes    Fill history for rosuvastatin  20 mg: 90 DS 03/31/2022, 09/22/2023  Hyperlipidemia/ASCVD Risk Reduction  Current lipid lowering medications: rosuvastatin  20 mg daily - Pt notes he was not taking rosuvastatin  prior to recent appt due to exercising more. He states he has been taking it the last couple of weeks but not consistently daily.   Current physical activity: has been exercising   The 10-year ASCVD risk score (Arnett DK, et al., 2019) is: 3.2%   Values used to calculate the score:     Age: 7 years     Clincally relevant sex: Male     Is Non-Hispanic African American: No     Diabetic: No     Tobacco smoker: No     Systolic Blood Pressure: 124 mmHg     Is BP treated: No     HDL Cholesterol: 29.4 mg/dL     Total Cholesterol: 227 mg/dL    Objective:  Lab Results  Component Value Date   HGBA1C 5.9 04/06/2024    Lab Results  Component Value Date   CREATININE 0.96 04/06/2024   BUN 13 04/06/2024   NA 139 04/06/2024   K 4.1 04/06/2024   CL 104 04/06/2024   CO2 28 04/06/2024    Lab Results  Component  Value Date   CHOL 227 (H) 04/06/2024   HDL 29.40 (L) 04/06/2024   LDLCALC 119 (H) 04/06/2024   LDLDIRECT 123.0 09/21/2023   TRIG 392.0 (H) 04/06/2024   CHOLHDL 8 04/06/2024    Medications Reviewed Today     Reviewed by Merceda Lela SAUNDERS, RPH (Pharmacist) on 04/28/24 at [phone]  Med List Status: <None>   Medication Order Taking? Sig Documenting Provider Last Dose Status Informant  pantoprazole  (PROTONIX ) 40 MG tablet [id number]  Take 1 tablet (40 mg total) by mouth daily.  Patient not taking: Reported on 04/06/2024   Ragan, Michael, FNP  Active   rosuvastatin  (CRESTOR ) 20 MG tablet [id number] Yes Take 1 tablet (20 mg total) by mouth daily. Sagardia, Miguel Jose, MD  Active   Vitamin D , Ergocalciferol , (DRISDOL ) 1.25 MG (50000 UNIT) CAPS capsule [id number]  TAKE 1 CAPSULE BY MOUTH EVERY 7 DAYS  Patient not taking: Reported on 04/28/2024   Sagardia, Miguel Jose, MD  Active               Assessment/Plan:   Hyperlipidemia/ASCVD Risk Reduction: - Currently uncontrolled. LDL goal <100 - Reviewed long term complications of uncontrolled cholesterol - Reviewed dietary recommendations including increased fiber/protein, reducing high fat foods - Reviewed lifestyle recommendations including regular exercise - Recommend to take rosuvastatin  consistently. Will recheck lipid panel towards end of September     Follow Up  Plan: Call pt to remind him of lipid panel towards end of september  Darrelyn Drum, PharmD, BCPS, CPP Clinical Pharmacist Practitioner  Primary Care at [facility] [phone]

## 2024-04-28 NOTE — Patient Instructions (Signed)
 It was a pleasure speaking with you today!  Take rosuvastatin  20 mg every day. Continue working on diet and exercise to help lower cholesterol.  Repeat cholesterol lab towards the end of September.  Feel free to call with any questions or concerns!  Darrelyn Drum, PharmD, BCPS, CPP Clinical Pharmacist Practitioner [facility] at [facility] [phone]

## 2024-07-21 ENCOUNTER — Telehealth: Payer: Self-pay | Admitting: Pharmacist

## 2024-07-21 DIAGNOSIS — E785 Hyperlipidemia, unspecified: Secondary | ICD-10-CM

## 2024-07-21 NOTE — Telephone Encounter (Signed)
 Contacted patient to remind him to come for walk in lipid recheck since July telephone appt. Left voicemail and sent MyChart with information.   Darrelyn Drum, PharmD, BCPS, CPP Clinical Pharmacist Practitioner [place] [facility] [phone]

## 2024-08-08 ENCOUNTER — Ambulatory Visit: Payer: Self-pay | Admitting: Pharmacist

## 2024-08-08 ENCOUNTER — Ambulatory Visit (INDEPENDENT_AMBULATORY_CARE_PROVIDER_SITE_OTHER)

## 2024-08-08 ENCOUNTER — Ambulatory Visit (INDEPENDENT_AMBULATORY_CARE_PROVIDER_SITE_OTHER): Admitting: Emergency Medicine

## 2024-08-08 ENCOUNTER — Ambulatory Visit: Payer: Self-pay | Admitting: Emergency Medicine

## 2024-08-08 VITALS — BP 110/76 | HR 61 | Temp 97.8°F | Ht 69.0 in | Wt 226.2 lb

## 2024-08-08 DIAGNOSIS — M25551 Pain in right hip: Secondary | ICD-10-CM

## 2024-08-08 DIAGNOSIS — E785 Hyperlipidemia, unspecified: Secondary | ICD-10-CM

## 2024-08-08 DIAGNOSIS — M545 Low back pain, unspecified: Secondary | ICD-10-CM

## 2024-08-08 LAB — LIPID PANEL
Cholesterol: 154 mg/dL (ref 0–200)
HDL: 38.9 mg/dL — ABNORMAL LOW (ref >39.00)
LDL Cholesterol: 70 mg/dL (ref 0–99)
NonHDL: 115.43
Total CHOL/HDL Ratio: 4
Triglycerides: 228 mg/dL — ABNORMAL HIGH (ref 0.0–149.0)
VLDL: 45.6 mg/dL — ABNORMAL HIGH (ref 0.0–40.0)

## 2024-08-08 NOTE — Assessment & Plan Note (Signed)
 History of right knee surgery 2008 May be related.  Clinically stable.  No red flag signs or symptoms. Recommend x-ray today.  Will review images when available Pain management discussed May need Ortho referral.

## 2024-08-08 NOTE — Progress Notes (Signed)
 Carlos Lambert 43 y.o.   Chief Complaint  Patient presents with   Back Pain    Lower back and right hip. States it has gotten better but he still wants to get it checked out. Has been going on for years, lower back last month.     HISTORY OF PRESENT ILLNESS: This is a 43 y.o. male complaining of pain to lumbar area and right hip on and off for the past month Better today.  No associated symptoms.  Denies injuries. No other complaints or medical concerns today.  Back Pain Pertinent negatives include no abdominal pain, chest pain, dysuria, fever or headaches.     Prior to Admission medications   Medication Sig Start Date End Date Taking? Authorizing Provider  rosuvastatin  (CRESTOR ) 20 MG tablet Take 1 tablet (20 mg total) by mouth daily. 09/22/23  Yes Jahyra Sukup, Emil Schanz, MD  Vitamin D , Ergocalciferol , (DRISDOL ) 1.25 MG (50000 UNIT) CAPS capsule TAKE 1 CAPSULE BY MOUTH EVERY 7 DAYS 01/06/24  Yes [place] Emil Schanz, MD    No Known Allergies  Patient Active Problem List   Diagnosis Date Noted   Vitamin D  deficiency 04/04/2022   GERD (gastroesophageal reflux disease) 03/31/2022   Dyslipidemia 03/05/2020   Prediabetes 03/05/2020    Past Medical History:  Diagnosis Date   Chest pain    GERD (gastroesophageal reflux disease)    Hypercholesteremia    Pain in leg, unspecified     Past Surgical History:  Procedure Laterality Date   KNEE SURGERY      Social History   Socioeconomic History   Marital status: Divorced    Spouse name: Not on file   Number of children: 1   Years of education: Not on file   Highest education level: Some college, no degree  Occupational History   Not on file  Tobacco Use   Smoking status: Former   Smokeless tobacco: Never  Vaping Use   Vaping status: Never Used  Substance and Sexual Activity   Alcohol use: Not Currently   Drug use: No   Sexual activity: Not on file  Other Topics Concern   Not on file  Social History Narrative     Family originally from [place] and ancestors from [place]   Social Drivers of Health   Financial Resource Strain: Low Risk  (08/08/2024)   Overall Financial Resource Strain (CARDIA)    Difficulty of Paying Living Expenses: Not very hard  Food Insecurity: No Food Insecurity (08/08/2024)   Hunger Vital Sign    Worried About Running Out of Food in the Last Year: Never true    Ran Out of Food in the Last Year: Never true  Transportation Needs: No Transportation Needs (08/08/2024)   PRAPARE - Administrator, Civil Service (Medical): No    Lack of Transportation (Non-Medical): No  Physical Activity: Insufficiently Active (08/08/2024)   Exercise Vital Sign    Days of Exercise per Week: 2 days    Minutes of Exercise per Session: 30 min  Stress: No Stress Concern Present (08/08/2024)   [employer] of Occupational Health - Occupational Stress Questionnaire    Feeling of Stress: Not at all  Social Connections: Moderately Isolated (08/08/2024)   Social Connection and Isolation Panel    Frequency of Communication with Friends and Family: More than three times a week    Frequency of Social Gatherings with Friends and Family: Once a week    Attends Religious Services: More than 4 times per year  Active Member of Clubs or Organizations: No    Attends Engineer, Structural: Not on file    Marital Status: Divorced  Intimate Partner Violence: Not on file    Family History  Problem Relation Age of Onset   Hypertension Father    Hyperlipidemia Father    Cancer Mother        LUNG     Review of Systems  Constitutional: Negative.  Negative for chills and fever.  HENT: Negative.  Negative for congestion and sore throat.   Respiratory: Negative.  Negative for cough and shortness of breath.   Cardiovascular: Negative.  Negative for chest pain and palpitations.  Gastrointestinal:  Negative for abdominal pain, diarrhea, nausea and vomiting.  Genitourinary:  Negative.  Negative for dysuria and hematuria.  Musculoskeletal:  Positive for back pain.  Skin: Negative.  Negative for rash.  Neurological: Negative.  Negative for dizziness and headaches.  All other systems reviewed and are negative.   Vitals:   08/08/24 0921  BP: 110/76  Pulse: 61  Temp: 97.8 F (36.6 C)  SpO2: 97%    Physical Exam Vitals reviewed.  Constitutional:      Appearance: Normal appearance.  HENT:     Head: Normocephalic.  Eyes:     Extraocular Movements: Extraocular movements intact.  Cardiovascular:     Rate and Rhythm: Normal rate.  Pulmonary:     Effort: Pulmonary effort is normal.  Abdominal:     Palpations: Abdomen is soft.     Tenderness: There is no abdominal tenderness.  Musculoskeletal:     Lumbar back: No spasms, tenderness or bony tenderness. Normal range of motion. Negative right straight leg raise test and negative left straight leg raise test.  Skin:    General: Skin is warm and dry.  Neurological:     Mental Status: He is alert.  Psychiatric:        Mood and Affect: Mood normal.        Behavior: Behavior normal.    DG HIP UNILAT W OR W/O PELVIS 2-3 VIEWS RIGHT Result Date: 08/08/2024 EXAM: 2 or 3 VIEW(S) XRAY OF THE RIGHT HIP 08/08/2024 09:37:55 AM COMPARISON: None available. CLINICAL HISTORY: Right hip pain. Right hip pain x one year.and low back pain x one month. Hx of surgery to right knee. FINDINGS: BONES AND JOINTS: No acute fracture or focal osseous lesion. The hip joint is maintained. No significant degenerative changes. SOFT TISSUES: The soft tissues are unremarkable. IMPRESSION: 1. No significant abnormality in the right hip or visualized pelvis. Electronically signed by: Evalene Coho MD 08/08/2024 10:01 AM EDT RP Workstation: [id number]   DG Lumbar Spine 2-3 Views Result Date: 08/08/2024 EXAM: 2 or 3 VIEW(S) XRAY OF THE LUMBAR SPINE 08/08/2024 09:37:55 AM COMPARISON: None available. CLINICAL HISTORY: Lumbar pain. Right hip  pain x one year.and low back pain x one month. Hx of surgery to right knee. FINDINGS: BONES: No acute fracture. No aggressive appearing osseous lesion. Alignment is normal. DISCS AND DEGENERATIVE CHANGES: No severe degenerative changes. SOFT TISSUES: No acute abnormality. IMPRESSION: 1. No acute abnormality of the lumbar spine. Electronically signed by: Evalene Coho MD 08/08/2024 10:01 AM EDT RP Workstation: [id number]     ASSESSMENT & PLAN: Problem List Items Addressed This Visit       Other   Dyslipidemia   Lumbar pain - Primary   Clinically stable.  No red flag signs or symptoms. Mechanical in nature. Pain management discussed. Recommend x-rays today.  Will review images when  available. May need Ortho referral.      Relevant Orders   DG Lumbar Spine 2-3 Views   Right hip pain   History of right knee surgery 2008 May be related.  Clinically stable.  No red flag signs or symptoms. Recommend x-ray today.  Will review images when available Pain management discussed May need Ortho referral.      Relevant Orders   DG HIP UNILAT W OR W/O PELVIS 2-3 VIEWS RIGHT   Patient Instructions  Acute Back Pain, Adult Acute back pain is sudden and usually short-lived. It is often caused by an injury to the muscles and tissues in the back. The injury may result from: A muscle, tendon, or ligament getting overstretched or torn. Ligaments are tissues that connect bones to each other. Lifting something improperly can cause a back strain. Wear and tear (degeneration) of the spinal disks. Spinal disks are circular tissue that provide cushioning between the bones of the spine (vertebrae). Twisting motions, such as while playing sports or doing yard work. A hit to the back. Arthritis. You may have a physical exam, lab tests, and imaging tests to find the cause of your pain. Acute back pain usually goes away with rest and home care. Follow these instructions at home: Managing pain, stiffness, and  swelling Take over-the-counter and prescription medicines only as told by your health care provider. Treatment may include medicines for pain and inflammation that are taken by mouth or applied to the skin, or muscle relaxants. Your health care provider may recommend applying ice during the first 24-48 hours after your pain starts. To do this: Put ice in a plastic bag. Place a towel between your skin and the bag. Leave the ice on for 20 minutes, 2-3 times a day. Remove the ice if your skin turns bright red. This is very important. If you cannot feel pain, heat, or cold, you have a greater risk of damage to the area. If directed, apply heat to the affected area as often as told by your health care provider. Use the heat source that your health care provider recommends, such as a moist heat pack or a heating pad. Place a towel between your skin and the heat source. Leave the heat on for 20-30 minutes. Remove the heat if your skin turns bright red. This is especially important if you are unable to feel pain, heat, or cold. You have a greater risk of getting burned. Activity  Do not stay in bed. Staying in bed for more than 1-2 days can delay your recovery. Sit up and stand up straight. Avoid leaning forward when you sit or hunching over when you stand. If you work at a desk, sit close to it so you do not need to lean over. Keep your chin tucked in. Keep your neck drawn back, and keep your elbows bent at a 90-degree angle (right angle). Sit high and close to the steering wheel when you drive. Add lower back (lumbar) support to your car seat, if needed. Take short walks on even surfaces as soon as you are able. Try to increase the length of time you walk each day. Do not sit, drive, or stand in one place for more than 30 minutes at a time. Sitting or standing for long periods of time can put stress on your back. Do not drive or use heavy machinery while taking prescription pain medicine. Use proper  lifting techniques. When you bend and lift, use positions that put less stress on  your back: [place] your knees. Keep the load close to your body. Avoid twisting. Exercise regularly as told by your health care provider. Exercising helps your back heal faster and helps prevent back injuries by keeping muscles strong and flexible. Work with a physical therapist to make a safe exercise program, as recommended by your health care provider. Do any exercises as told by your physical therapist. Lifestyle Maintain a healthy weight. Extra weight puts stress on your back and makes it difficult to have good posture. Avoid activities or situations that make you feel anxious or stressed. Stress and anxiety increase muscle tension and can make back pain worse. Learn ways to manage anxiety and stress, such as through exercise. General instructions Sleep on a firm mattress in a comfortable position. Try lying on your side with your knees slightly bent. If you lie on your back, put a pillow under your knees. Keep your head and neck in a straight line with your spine (neutral position) when using electronic equipment like smartphones or pads. To do this: Raise your smartphone or pad to look at it instead of bending your head or neck to look down. Put the smartphone or pad at the level of your face while looking at the screen. Follow your treatment plan as told by your health care provider. This may include: Cognitive or behavioral therapy. Acupuncture or massage therapy. Meditation or yoga. Contact a health care provider if: You have pain that is not relieved with rest or medicine. You have increasing pain going down into your legs or buttocks. Your pain does not improve after 2 weeks. You have pain at night. You lose weight without trying. You have a fever or chills. You develop nausea or vomiting. You develop abdominal pain. Get help right away if: You develop new bowel or bladder control problems. You  have unusual weakness or numbness in your arms or legs. You feel faint. These symptoms may represent a serious problem that is an emergency. Do not wait to see if the symptoms will go away. Get medical help right away. Call your local emergency services (911 in the [place] Do not drive yourself to the hospital. Summary Acute back pain is sudden and usually short-lived. Use proper lifting techniques. When you bend and lift, use positions that put less stress on your back. Take over-the-counter and prescription medicines only as told by your health care provider, and apply heat or ice as told. This information is not intended to replace advice given to you by your health care provider. Make sure you discuss any questions you have with your health care provider. Document Revised: 12/20/2020 Document Reviewed: 12/20/2020 Elsevier Patient Education  2024 Elsevier Inc.    Emil Schaumann, MD [facility]

## 2024-08-08 NOTE — Assessment & Plan Note (Signed)
 Clinically stable.  No red flag signs or symptoms. Mechanical in nature. Pain management discussed. Recommend x-rays today.  Will review images when available. May need Ortho referral.

## 2024-08-08 NOTE — Patient Instructions (Signed)
 Acute Back Pain, Adult Acute back pain is sudden and usually short-lived. It is often caused by an injury to the muscles and tissues in the back. The injury may result from: A muscle, tendon, or ligament getting overstretched or torn. Ligaments are tissues that connect bones to each other. Lifting something improperly can cause a back strain. Wear and tear (degeneration) of the spinal disks. Spinal disks are circular tissue that provide cushioning between the bones of the spine (vertebrae). Twisting motions, such as while playing sports or doing yard work. A hit to the back. Arthritis. You may have a physical exam, lab tests, and imaging tests to find the cause of your pain. Acute back pain usually goes away with rest and home care. Follow these instructions at home: Managing pain, stiffness, and swelling Take over-the-counter and prescription medicines only as told by your health care provider. Treatment may include medicines for pain and inflammation that are taken by mouth or applied to the skin, or muscle relaxants. Your health care provider may recommend applying ice during the first 24-48 hours after your pain starts. To do this: Put ice in a plastic bag. Place a towel between your skin and the bag. Leave the ice on for 20 minutes, 2-3 times a day. Remove the ice if your skin turns bright red. This is very important. If you cannot feel pain, heat, or cold, you have a greater risk of damage to the area. If directed, apply heat to the affected area as often as told by your health care provider. Use the heat source that your health care provider recommends, such as a moist heat pack or a heating pad. Place a towel between your skin and the heat source. Leave the heat on for 20-30 minutes. Remove the heat if your skin turns bright red. This is especially important if you are unable to feel pain, heat, or cold. You have a greater risk of getting burned. Activity  Do not stay in bed. Staying in  bed for more than 1-2 days can delay your recovery. Sit up and stand up straight. Avoid leaning forward when you sit or hunching over when you stand. If you work at a desk, sit close to it so you do not need to lean over. Keep your chin tucked in. Keep your neck drawn back, and keep your elbows bent at a 90-degree angle (right angle). Sit high and close to the steering wheel when you drive. Add lower back (lumbar) support to your car seat, if needed. Take short walks on even surfaces as soon as you are able. Try to increase the length of time you walk each day. Do not sit, drive, or stand in one place for more than 30 minutes at a time. Sitting or standing for long periods of time can put stress on your back. Do not drive or use heavy machinery while taking prescription pain medicine. Use proper lifting techniques. When you bend and lift, use positions that put less stress on your back: [place] your knees. Keep the load close to your body. Avoid twisting. Exercise regularly as told by your health care provider. Exercising helps your back heal faster and helps prevent back injuries by keeping muscles strong and flexible. Work with a physical therapist to make a safe exercise program, as recommended by your health care provider. Do any exercises as told by your physical therapist. Lifestyle Maintain a healthy weight. Extra weight puts stress on your back and makes it difficult to have good  posture. Avoid activities or situations that make you feel anxious or stressed. Stress and anxiety increase muscle tension and can make back pain worse. Learn ways to manage anxiety and stress, such as through exercise. General instructions Sleep on a firm mattress in a comfortable position. Try lying on your side with your knees slightly bent. If you lie on your back, put a pillow under your knees. Keep your head and neck in a straight line with your spine (neutral position) when using electronic equipment like  smartphones or pads. To do this: Raise your smartphone or pad to look at it instead of bending your head or neck to look down. Put the smartphone or pad at the level of your face while looking at the screen. Follow your treatment plan as told by your health care provider. This may include: Cognitive or behavioral therapy. Acupuncture or massage therapy. Meditation or yoga. Contact a health care provider if: You have pain that is not relieved with rest or medicine. You have increasing pain going down into your legs or buttocks. Your pain does not improve after 2 weeks. You have pain at night. You lose weight without trying. You have a fever or chills. You develop nausea or vomiting. You develop abdominal pain. Get help right away if: You develop new bowel or bladder control problems. You have unusual weakness or numbness in your arms or legs. You feel faint. These symptoms may represent a serious problem that is an emergency. Do not wait to see if the symptoms will go away. Get medical help right away. Call your local emergency services (911 in the [place] Do not drive yourself to the hospital. Summary Acute back pain is sudden and usually short-lived. Use proper lifting techniques. When you bend and lift, use positions that put less stress on your back. Take over-the-counter and prescription medicines only as told by your health care provider, and apply heat or ice as told. This information is not intended to replace advice given to you by your health care provider. Make sure you discuss any questions you have with your health care provider. Document Revised: 12/20/2020 Document Reviewed: 12/20/2020 Elsevier Patient Education  2024 [employer].

## 2024-10-02 ENCOUNTER — Ambulatory Visit
Admission: RE | Admit: 2024-10-02 | Discharge: 2024-10-02 | Disposition: A | Discharge status: Home / Self Care | Source: Ambulatory Visit | Attending: Family Medicine | Admitting: Family Medicine

## 2024-10-02 VITALS — BP 134/80 | HR 71 | Temp 98.1°F | Resp 17

## 2024-10-02 DIAGNOSIS — H9203 Otalgia, bilateral: Secondary | ICD-10-CM

## 2024-10-02 DIAGNOSIS — H9202 Otalgia, left ear: Secondary | ICD-10-CM | POA: Diagnosis not present

## 2024-10-02 DIAGNOSIS — R52 Pain, unspecified: Secondary | ICD-10-CM | POA: Diagnosis not present

## 2024-10-02 DIAGNOSIS — H9201 Otalgia, right ear: Secondary | ICD-10-CM | POA: Diagnosis not present

## 2024-10-02 LAB — POCT INFLUENZA A/B
Influenza A, POC: NEGATIVE
Influenza B, POC: NEGATIVE

## 2024-10-02 MED ORDER — FLUTICASONE PROPIONATE 50 MCG/ACT NA SUSP: 2.0000 | Freq: Every day | NASAL | 0 refills | Status: AC

## 2024-10-02 NOTE — ED Triage Notes (Signed)
 Pt c/o bilateral ear discomfort and fullness since Sat. Denies fever. No hx of ear wax impactions.

## 2024-10-02 NOTE — Discharge Instructions (Addendum)
 Flu test is negative Ear pain is due to congestion I have prescribed Flonase  to help release fluid from behind the ears Return as needed

## 2024-10-02 NOTE — ED Provider Notes (Signed)
 " [facility]    CSN: [id number] Arrival date & time: 10/02/24  1752      History   Chief Complaint Chief Complaint  Patient presents with   Otalgia    bilateral    HPI Carlos Lambert is a 43 y.o. male.   Patient has bilateral ear pain.  Left greater than right.  Some mild sinus congestion.  No sore throat.  No fever or chills.  Some body aches.  Wants flu testing.  Has not had an ear infection since he was 6.  Generally healthy    Past Medical History:  Diagnosis Date   Chest pain    GERD (gastroesophageal reflux disease)    Hypercholesteremia    Pain in leg, unspecified     Patient Active Problem List   Diagnosis Date Noted   Lumbar pain 08/08/2024   Right hip pain 08/08/2024   Vitamin D  deficiency 04/04/2022   GERD (gastroesophageal reflux disease) 03/31/2022   Dyslipidemia 03/05/2020   Prediabetes 03/05/2020    Past Surgical History:  Procedure Laterality Date   KNEE SURGERY         Home Medications    Prior to Admission medications  Medication Sig Start Date End Date Taking? Authorizing Provider  fluticasone  (FLONASE ) 50 MCG/ACT nasal spray Place 2 sprays into both nostrils daily. 10/02/24  Yes Maranda Jamee Jacob, MD  rosuvastatin  (CRESTOR ) 20 MG tablet Take 1 tablet (20 mg total) by mouth daily. 09/22/23   Purcell Emil Schanz, MD  Vitamin D , Ergocalciferol , (DRISDOL ) 1.25 MG (50000 UNIT) CAPS capsule TAKE 1 CAPSULE BY MOUTH EVERY 7 DAYS 01/06/24   Purcell Emil Schanz, MD    Family History Family History  Problem Relation Age of Onset   Hypertension Father    Hyperlipidemia Father    Cancer Mother        LUNG    Social History Social History[1]   Allergies   Patient has no known allergies.   Review of Systems Review of Systems See HPI  Physical Exam Triage Vital Signs ED Triage Vitals [10/02/24 1841]  Encounter Vitals Group     BP 134/80     Girls Systolic BP Percentile      Girls Diastolic BP Percentile       Boys Systolic BP Percentile      Boys Diastolic BP Percentile      Pulse Rate 71     Resp 17     Temp 98.1 F (36.7 C)     Temp Source Oral     SpO2 97 %     Weight      Height      Head Circumference      Peak Flow      Pain Score 3     Pain Loc      Pain Education      Exclude from Growth Chart    No data found.  Updated Vital Signs BP 134/80 (BP Location: Right Arm)   Pulse 71   Temp 98.1 F (36.7 C) (Oral)   Resp 17   SpO2 97%   :     Physical Exam Constitutional:      General: He is not in acute distress.    Appearance: He is well-developed.  HENT:     Head: Normocephalic and atraumatic.     Right Ear: Tympanic membrane and ear canal normal.     Left Ear: Tympanic membrane and ear canal normal.     Nose: Nose  normal. No congestion.     Mouth/Throat:     Mouth: Mucous membranes are moist.     Pharynx: Posterior oropharyngeal erythema present.  Eyes:     Conjunctiva/sclera: Conjunctivae normal.     Pupils: Pupils are equal, round, and reactive to light.  Cardiovascular:     Rate and Rhythm: Normal rate and regular rhythm.     Heart sounds: Normal heart sounds.  Pulmonary:     Effort: Pulmonary effort is normal. No respiratory distress.     Breath sounds: Normal breath sounds.  Musculoskeletal:        General: Normal range of motion.     Cervical back: Normal range of motion.  Skin:    General: Skin is warm and dry.  Neurological:     Mental Status: He is alert.      UC Treatments / Results  Labs (all labs ordered are listed, but only abnormal results are displayed) Labs Reviewed  POCT INFLUENZA A/B    EKG   Radiology No results found.  Procedures Procedures (including critical care time)  Medications Ordered in UC Medications - No data to display  Initial Impression / Assessment and Plan / UC Course  I have reviewed the triage vital signs and the nursing notes.  Pertinent labs & imaging results that were available during my care of  the patient were reviewed by me and considered in my medical decision making (see chart for details).     Final Clinical Impressions(s) / UC Diagnoses   Final diagnoses:  Generalized body aches  Otalgia of right ear  Otalgia of left ear     Discharge Instructions      Flu test is negative Ear pain is due to congestion I have prescribed Flonase  to help release fluid from behind the ears Return as needed   ED Prescriptions     Medication Sig Dispense Auth. Provider   fluticasone  (FLONASE ) 50 MCG/ACT nasal spray Place 2 sprays into both nostrils daily. 16 g Maranda Jamee Jacob, MD      PDMP not reviewed this encounter.    [1]  Social History Tobacco Use   Smoking status: Former   Smokeless tobacco: Never  Vaping Use   Vaping status: Never Used  Substance Use Topics   Alcohol use: Not Currently   Drug use: No     Maranda Jamee Jacob, MD 10/02/24 1939  "

## 2025-04-09 ENCOUNTER — Encounter: Admitting: Emergency Medicine
